# Patient Record
Sex: Female | Born: 2000 | Race: White | Hispanic: No | Marital: Single | Smoking: Never smoker
Health system: Southern US, Community
[De-identification: ages and names within clinical notes are randomized; demographics above are authoritative.]

## PROBLEM LIST (undated history)

## (undated) DIAGNOSIS — J45909 Unspecified asthma, uncomplicated: Secondary | ICD-10-CM

## (undated) DIAGNOSIS — F419 Anxiety disorder, unspecified: Secondary | ICD-10-CM

## (undated) DIAGNOSIS — R519 Headache, unspecified: Secondary | ICD-10-CM

## (undated) DIAGNOSIS — R51 Headache: Secondary | ICD-10-CM

## (undated) DIAGNOSIS — Z9889 Other specified postprocedural states: Secondary | ICD-10-CM

## (undated) DIAGNOSIS — R112 Nausea with vomiting, unspecified: Secondary | ICD-10-CM

## (undated) HISTORY — PX: NO PAST SURGERIES: SHX2092

## (undated) HISTORY — PX: MULTIPLE TOOTH EXTRACTIONS: SHX2053

## (undated) HISTORY — DX: Headache, unspecified: R51.9

## (undated) HISTORY — DX: Headache: R51

---
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
## 2022-05-09 ENCOUNTER — Ambulatory Visit (INDEPENDENT_AMBULATORY_CARE_PROVIDER_SITE_OTHER): Payer: BC Managed Care – PPO | Admitting: Neurology

## 2022-05-09 ENCOUNTER — Encounter (INDEPENDENT_AMBULATORY_CARE_PROVIDER_SITE_OTHER): Payer: Self-pay | Admitting: Neurology

## 2022-05-09 VITALS — BP 116/74 | HR 88 | Wt 170.4 lb

## 2022-05-09 DIAGNOSIS — R42 Dizziness and giddiness: Secondary | ICD-10-CM | POA: Diagnosis not present

## 2022-05-09 DIAGNOSIS — R519 Headache, unspecified: Secondary | ICD-10-CM

## 2022-05-09 DIAGNOSIS — G43109 Migraine with aura, not intractable, without status migrainosus: Secondary | ICD-10-CM

## 2022-05-09 MED ORDER — TOPIRAMATE 25 MG PO TABS: 25.0000 mg | ORAL_TABLET | Freq: Two times a day (BID) | ORAL | 2 refills | Status: DC

## 2022-05-09 NOTE — Patient Instructions (Signed)
We will increase the dose of Topamax to 25 mg twice daily ?Continue with more hydration ?Continue taking dietary supplements ?Make a headache diary ?See an ophthalmologist for eye exam ?Return in 3 months for follow-up visit ?

## 2022-05-09 NOTE — Progress Notes (Signed)
Patient: Leah Stark MRN: [id number] ?Sex: female DOB: 2001-02-04 ? ?Provider: Teressa Lower, MD ?Location of Care: [facility] ? ?Note type: Routine return visit ? ?Referral Source: Dion Body, MD ?History from: patient and [facility] chart ?Chief Complaint: basilar migraine ? ?History of Present Illness: ?Zakeria Kartchner is a 21 y.o. female is here for follow-up management of headache and dizziness. 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  She has been on Topamax since 2018 with fairly low-dose with some help with the headaches. ?She was last seen in July 2022 and over the past year she has been having occasional headaches probably 1 to 3 days a month needed OTC medications but she has been having more days of dizzy spells and lightheadedness off and on and occasionally will be severe but usually she would not have any passing out spells.  Most of the headaches were with mild to moderate intensity but she never had any vomiting with the headaches. ?She has been taking low-dose Topamax at 25 mg every night regularly without any missing doses.  She is also taking dietary supplements particularly riboflavin and magnesium. ?She has not had any eye exam recently.  She usually sleeps well without any difficulty and with no awakening headaches. ? ?Review of Systems: ?Review of system as per HPI, otherwise negative. ? ?Past Medical History:  ?Diagnosis Date  ? Headache   ? ?Hospitalizations: No., Head Injury: No., Nervous System Infections: No., Immunizations up to date: Yes.   ? ? ?Surgical History ?Past Surgical History:  ?Procedure Laterality Date  ? NO PAST SURGERIES    ? ? ?Family History ?family history includes Heart Problems in her paternal grandmother; Heart attack in her maternal grandfather. ? ? ?Social History ?Social History  ? ?Socioeconomic History  ? Marital  status: Single  ?  Spouse name: Not on file  ? Number of children: Not on file  ? Years of education: Not on file  ? Highest education level: Not on file  ?Occupational History  ? Not on file  ?Tobacco Use  ? Smoking status: Never  ? Smokeless tobacco: Never  ?Substance and Sexual Activity  ? Alcohol use: No  ? Drug use: No  ? Sexual activity: Never  ?Other Topics Concern  ? Not on file  ?Social History Narrative  ? Jachelle is going to 12th grade at [employer] . She does well in school.  ? Lives with parents and siblings. She enjoys rowing, arts/crafts, and being on her phone.  ? ?Social Determinants of Health  ? ?Financial Resource Strain: Not on file  ?Food Insecurity: Not on file  ?Transportation Needs: Not on file  ?Physical Activity: Not on file  ?Stress: Not on file  ?Social Connections: Not on file  ? ? ? ?Allergies  ?Allergen Reactions  ? Cephalexin   ?  other  ? ? ?Physical Exam ?BP 116/74   Pulse 88   Wt 170 lb 6.7 oz (77.3 kg)  ?Gen: Awake, alert, not in distress ?Skin: No rash, No neurocutaneous stigmata. ?HEENT: Normocephalic, no dysmorphic features, no conjunctival injection, nares patent, mucous membranes moist, oropharynx clear. ?Neck: Supple, no meningismus. No focal tenderness. ?Resp: Clear to auscultation bilaterally ?CV: Regular rate, normal S1/S2, no murmurs, no rubs ?Abd: BS present, abdomen soft, non-tender, non-distended. No hepatosplenomegaly or mass ?Ext: Warm and well-perfused. No deformities, no muscle wasting, ROM full. ? ?  Neurological Examination: ?MS: Awake, alert, interactive. Normal eye contact, answered the questions appropriately, speech was fluent,  Normal comprehension.  Attention and concentration were normal. ?Cranial Nerves: Pupils were equal and reactive to light ( 5-89mm);  normal fundoscopic exam with sharp discs, visual field full with confrontation test; EOM normal, no nystagmus; no ptsosis, no double vision, intact facial sensation, face symmetric with full strength  of facial muscles, hearing intact to finger rub bilaterally, palate elevation is symmetric, tongue protrusion is symmetric with full movement to both sides.  Sternocleidomastoid and trapezius are with normal strength. ?Tone-Normal ?Strength-Normal strength in all muscle groups ?DTRs- ? Biceps Triceps Brachioradialis Patellar Ankle  ?R 2+ 2+ 2+ 2+ 2+  ?L 2+ 2+ 2+ 2+ 2+  ? ?Plantar responses flexor bilaterally, no clonus noted ?Sensation: Intact to light touch, temperature, vibration, Romberg negative. ?Coordination: No dysmetria on FTN test. No difficulty with balance. ?Gait: Normal walk and run. Tandem gait was normal. Was able to perform toe walking and heel walking without difficulty. ? ? ?Assessment and Plan ?1. Basilar migraine   ?2. Dizziness   ?3. Mild headache   ? ?This is an 21 year old female with episodes of headache and dizziness and lightheadedness with possibility of tension type headache and migraine with slightly more frequent symptoms over the past couple of weeks which was around the time of her end of semester exams. ?I would recommend to increase hydration and drink more water throughout the day particularly in the morning. ?Since she is on very low-dose of Topamax, will increase the dose of Topamax to 25 mg twice daily to see if there would be any help with her symptoms ?She needs to have adequate sleep and limited screen time. ?If she continues with more symptoms, she might need to have further work-up with ophthalmology exam and she may need to be seen by ENT service. ?Again if she continues with more symptoms then we may consider further testing such as brain MRI ?I would like to see her in 3 months for follow-up visit to see how she does and then decide regarding further testing or treatment.  She understood and agreed with the plan. ? ?Meds ordered this encounter  ?Medications  ? topiramate (TOPAMAX) 25 MG tablet  ?  Sig: Take 1 tablet (25 mg total) by mouth 2 (two) times daily.  ?   Dispense:  180 tablet  ?  Refill:  2  ? ?No orders of the defined types were placed in this encounter. ?  ?

## 2022-08-01 ENCOUNTER — Ambulatory Visit (INDEPENDENT_AMBULATORY_CARE_PROVIDER_SITE_OTHER): Payer: BC Managed Care – PPO | Admitting: Neurology

## 2022-08-01 ENCOUNTER — Encounter (INDEPENDENT_AMBULATORY_CARE_PROVIDER_SITE_OTHER): Payer: Self-pay | Admitting: Neurology

## 2022-08-01 VITALS — BP 102/60 | HR 93 | Wt 169.1 lb

## 2022-08-01 DIAGNOSIS — G43109 Migraine with aura, not intractable, without status migrainosus: Secondary | ICD-10-CM

## 2022-08-01 DIAGNOSIS — R42 Dizziness and giddiness: Secondary | ICD-10-CM | POA: Diagnosis not present

## 2022-08-01 DIAGNOSIS — R519 Headache, unspecified: Secondary | ICD-10-CM

## 2022-08-01 MED ORDER — TOPIRAMATE 25 MG PO TABS: 25.0000 mg | ORAL_TABLET | Freq: Two times a day (BID) | ORAL | 2 refills | Status: DC

## 2022-08-01 NOTE — Progress Notes (Signed)
Patient: Leah Stark MRN: [id number] Sex: female DOB: Jan 09, 2001  Provider: Keturah Shavers, MD Location of Care: [facility]  Note type: Routine return visit  Referral Source: Diamantina Monks, MD History from: patient and [facility] chart Chief Complaint: 2 headaches int he last 14 days  History of Present Illness: Leah Stark is a 21 y.o. female is here for follow-up management of headaches and dizziness Improvement. Patient has been seen with episodes of migraine and tension type headaches and occasional basilar migraine for which she has been on Topamax with low-dose with fairly good headache control although she has been having episodes of dizziness off and on with some orthostatic changes and positional changes over the past couple of years without any improvement.  On her last visit in May she was recommended to increase the dose of Topamax to 25 mg twice daily to see if the episodes of dizziness could be part of migraine and would get better.  She is also taking dietary supplements. Since her last visit and over the past 3 months she has not had any frequent headaches but she is still having dizziness and vasovagal events with some positional changes and orthostatic dizziness and occasional balance issues during walking. She denies having any vomiting or any visual changes such as double vision or blurry vision but she thinks that the episodes of dizziness and balance issues are not getting better and probably slightly getting worse. She does have some anxiety issues for which she has been seen and followed by counselor and psychologist but she is not sure if this explains would be related to anxiety or something else.  She has been drinking a lot of water on a daily basis.   Review of Systems: Review of system as per HPI, otherwise negative.  Past Medical History:  Diagnosis Date   Headache    Hospitalizations: No., Head Injury: No., Nervous System Infections: No.,  Immunizations up to date: Yes.     Surgical History Past Surgical History:  Procedure Laterality Date   NO PAST SURGERIES      Family History family history includes Heart Problems in her paternal grandmother; Heart attack in her maternal grandfather.   Social History Social History   Socioeconomic History   Marital status: Single    Spouse name: Not on file   Number of children: Not on file   Years of education: Not on file   Highest education level: Not on file  Occupational History   Not on file  Tobacco Use   Smoking status: Never   Smokeless tobacco: Never  Substance and Sexual Activity   Alcohol use: No   Drug use: No   Sexual activity: Never  Other Topics Concern   Not on file  Social History Narrative   [name] is going to 12th grade at [employer] . She does well in school.   Lives with parents and siblings. She enjoys rowing, arts/crafts, and being on her phone.   Social Determinants of Health   Financial Resource Strain: Not on file  Food Insecurity: Not on file  Transportation Needs: Not on file  Physical Activity: Not on file  Stress: Not on file  Social Connections: Not on file     Allergies  Allergen Reactions   Cephalexin     other    Physical Exam BP 102/60   Pulse 93   Wt 169 lb 1.5 oz (76.7 kg)  Gen: Awake, alert, not in distress Skin: No rash, No neurocutaneous  stigmata. HEENT: Normocephalic, no dysmorphic features, no conjunctival injection, nares patent, mucous membranes moist, oropharynx clear. Neck: Supple, no meningismus. No focal tenderness. Resp: Clear to auscultation bilaterally CV: Regular rate, normal S1/S2, no murmurs, no rubs Abd: BS present, abdomen soft, non-tender, non-distended. No hepatosplenomegaly or mass Ext: Warm and well-perfused. No deformities, no muscle wasting, ROM full.  Neurological Examination: MS: Awake, alert, interactive. Normal eye contact, answered the questions appropriately, speech was  fluent,  Normal comprehension.  Attention and concentration were normal. Cranial Nerves: Pupils were equal and reactive to light ( 5-36mm);  normal fundoscopic exam with sharp discs, visual field full with confrontation test; EOM normal, no nystagmus; no ptsosis, no double vision, intact facial sensation, face symmetric with full strength of facial muscles, hearing intact to finger rub bilaterally, palate elevation is symmetric, tongue protrusion is symmetric with full movement to both sides.  Sternocleidomastoid and trapezius are with normal strength. Tone-Normal Strength-Normal strength in all muscle groups DTRs-  Biceps Triceps Brachioradialis Patellar Ankle  R 2+ 2+ 2+ 2+ 2+  L 2+ 2+ 2+ 2+ 2+   Plantar responses flexor bilaterally, no clonus noted Sensation: Intact to light touch, temperature, vibration, Romberg negative. Coordination: No dysmetria on FTN test. No difficulty with balance. Gait: Normal walk and run. Tandem gait was normal. Was able to perform toe walking and heel walking without difficulty.   Assessment and Plan 1. Basilar migraine   2. Dizziness   3. Mild headache    This is an 21 year old female with history of migraine and tension type headaches with chronic dizzy spells which has been getting worse with some balance issues.  She has no focal findings on her neurological examination but but since she has been having progressively worsening of the symptoms with dizziness and balance issues, I would like to schedule her for a brain MRI for further evaluation and rule out structural abnormality and possibility of white matter disease or inner ear issues. I would recommend to continue the same dose of Topamax which is low-dose of 25 mg twice daily She will continue taking dietary supplements She will continue with more hydration and slight increase salt intake She may take occasional Tylenol or ibuprofen for moderate to severe headache I will ca patient with results of  brain MRI and if there is any further testing or treatment needed. I would like to see her in 5 months for follow-up visit.  She understood and agreed with the plan .    Meds ordered this encounter  Medications   topiramate (TOPAMAX) 25 MG tablet    Sig: Take 1 tablet (25 mg total) by mouth 2 (two) times daily.    Dispense:  180 tablet    Refill:  2   Orders Placed This Encounter  Procedures   MR BRAIN WO CONTRAST    Standing Status:   Future    Standing Expiration Date:   08/02/2023    Order Specific Question:   What is the patient's sedation requirement?    Answer:   No Sedation    Order Specific Question:   Does the patient have a pacemaker or implanted devices?    Answer:   No    Order Specific Question:   Preferred imaging location?    Answer:   [facility] (table limit - 500 lbs)

## 2022-08-01 NOTE — Patient Instructions (Addendum)
Since the headaches and particularly dizziness are getting worse with some balance issues, we will schedule for a brain MRI to rule out any structural abnormality Continue the same dose of Topamax at 25 mg twice daily Continue taking dietary supplements Continue with more hydration and slight increase salt intake Have some pauses between position change like from lying to sitting and then standing Return in 5 months for follow-up visit

## 2022-08-05 ENCOUNTER — Telehealth (INDEPENDENT_AMBULATORY_CARE_PROVIDER_SITE_OTHER): Payer: Self-pay

## 2022-08-05 NOTE — Telephone Encounter (Signed)
PA referral have been filled out, signed, and sent to [employer] for a new PA request. 08/05/2022

## 2022-10-01 ENCOUNTER — Ambulatory Visit (HOSPITAL_COMMUNITY)
Admission: RE | Admit: 2022-10-01 | Discharge: 2022-10-01 | Disposition: A | Discharge status: Home / Self Care | Payer: BC Managed Care – PPO | Source: Ambulatory Visit | Attending: Neurology | Admitting: Neurology

## 2022-10-01 DIAGNOSIS — R42 Dizziness and giddiness: Secondary | ICD-10-CM | POA: Insufficient documentation

## 2022-10-01 DIAGNOSIS — G43109 Migraine with aura, not intractable, without status migrainosus: Secondary | ICD-10-CM | POA: Insufficient documentation

## 2022-10-03 ENCOUNTER — Telehealth (INDEPENDENT_AMBULATORY_CARE_PROVIDER_SITE_OTHER): Payer: Self-pay | Admitting: Neurology

## 2022-10-03 NOTE — Telephone Encounter (Signed)
  Name of who is calling: Ray  Caller's Relationship to Patient: Annye Rusk contact number: [phone]  Provider they see: Dr.Nab  Reason for call: Calling to get results for MRI. Requesting a callback.      PRESCRIPTION REFILL ONLY  Name of prescription:  Pharmacy:

## 2022-10-04 NOTE — Telephone Encounter (Signed)
  Name of who is calling: Diane  Caller's Relationship to Patient:  Best contact number: [phone]  Provider they see: Dr. Secundino Ginger  Reason for call: Levander Campion with  radiology is calling wanting someone to read the results. And or she wants to confirm that we have the results in our chart since it was a big find.

## 2022-10-04 NOTE — Telephone Encounter (Signed)
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  No syrinx. Since she is above 79, I recommend mother to make an appointment with adult neurosurgery over the next few days or couple of weeks and then if there is any referral needed, we can send a referral.  Mother understood and agreed.

## 2022-12-13 ENCOUNTER — Other Ambulatory Visit (HOSPITAL_COMMUNITY): Payer: Self-pay | Admitting: Neurosurgery

## 2022-12-13 DIAGNOSIS — G935 Compression of brain: Secondary | ICD-10-CM

## 2022-12-28 ENCOUNTER — Ambulatory Visit (HOSPITAL_COMMUNITY): Payer: BC Managed Care – PPO

## 2022-12-28 ENCOUNTER — Ambulatory Visit (INDEPENDENT_AMBULATORY_CARE_PROVIDER_SITE_OTHER): Payer: BC Managed Care – PPO | Admitting: Neurology

## 2023-02-02 ENCOUNTER — Other Ambulatory Visit: Payer: Self-pay

## 2023-02-02 ENCOUNTER — Encounter (HOSPITAL_COMMUNITY): Payer: Self-pay | Admitting: *Deleted

## 2023-02-02 NOTE — Progress Notes (Addendum)
I did not find a H/P that was done within the last 30 days.  I called Dr. Windy Carina office, I spoke with Lorriane Shire.  Lorriane Shire said she will ask Dr. Saintclair Halsted to put the H/P in Epic.  Shaquita Bethards denies chest pain or shortness of breath. Patient denies having any s/s of Covid in her household, also denies any known exposure to Covid. Leah Stark has a new PCP, she has not seen yet, it is Dr. Rochele Raring, patient will see the Dr in the spring. Karlene Einstein has been seeing a peds Neurologist, Dr. Vaughan Sine following patient's Chiari malformation type 1, this has been stable, patient takes Topamax 2 times a day t decrease headaches.

## 2023-02-07 ENCOUNTER — Ambulatory Visit (HOSPITAL_COMMUNITY): Payer: BC Managed Care – PPO | Admitting: Anesthesiology

## 2023-02-07 ENCOUNTER — Other Ambulatory Visit: Payer: Self-pay

## 2023-02-07 ENCOUNTER — Ambulatory Visit (HOSPITAL_COMMUNITY)
Admission: RE | Admit: 2023-02-07 | Discharge: 2023-02-07 | Disposition: A | Discharge status: Home / Self Care | Payer: BC Managed Care – PPO | Source: Ambulatory Visit | Attending: Neurosurgery | Admitting: Neurosurgery

## 2023-02-07 ENCOUNTER — Encounter (HOSPITAL_COMMUNITY): Admission: RE | Disposition: A | Discharge status: Home / Self Care | Payer: Self-pay | Source: Home / Self Care

## 2023-02-07 ENCOUNTER — Ambulatory Visit (HOSPITAL_COMMUNITY)
Admission: RE | Admit: 2023-02-07 | Discharge: 2023-02-07 | Disposition: A | Discharge status: Home / Self Care | Payer: BC Managed Care – PPO | Attending: Neurosurgery | Admitting: Neurosurgery

## 2023-02-07 ENCOUNTER — Encounter (HOSPITAL_COMMUNITY): Payer: Self-pay

## 2023-02-07 DIAGNOSIS — H5500 Unspecified nystagmus: Secondary | ICD-10-CM | POA: Diagnosis not present

## 2023-02-07 DIAGNOSIS — G935 Compression of brain: Secondary | ICD-10-CM

## 2023-02-07 DIAGNOSIS — M4804 Spinal stenosis, thoracic region: Secondary | ICD-10-CM | POA: Diagnosis not present

## 2023-02-07 DIAGNOSIS — R42 Dizziness and giddiness: Secondary | ICD-10-CM | POA: Diagnosis present

## 2023-02-07 HISTORY — DX: Unspecified asthma, uncomplicated: J45.909

## 2023-02-07 HISTORY — PX: RADIOLOGY WITH ANESTHESIA: SHX6223

## 2023-02-07 HISTORY — DX: Anxiety disorder, unspecified: F41.9

## 2023-02-07 LAB — BASIC METABOLIC PANEL
Anion gap: 7 (ref 5–15)
BUN: 8 mg/dL (ref 6–20)
CO2: 23 mmol/L (ref 22–32)
Calcium: 9.8 mg/dL (ref 8.9–10.3)
Chloride: 108 mmol/L (ref 98–111)
Creatinine, Ser: 1.02 mg/dL — ABNORMAL HIGH (ref 0.44–1.00)
GFR, Estimated: >60 mL/min (ref >60)
Glucose, Bld: 100 mg/dL — ABNORMAL HIGH (ref 70–99)
Potassium: 3.8 mmol/L (ref 3.5–5.1)
Sodium: 138 mmol/L (ref 135–145)

## 2023-02-07 LAB — POCT PREGNANCY, URINE: Preg Test, Ur: NEGATIVE

## 2023-02-07 SURGERY — MRI WITH ANESTHESIA
Anesthesia: General

## 2023-02-07 MED ORDER — FENTANYL CITRATE (PF) 100 MCG/2ML IJ SOLN: 25.0000 ug | INTRAMUSCULAR | Status: DC | PRN

## 2023-02-07 MED ORDER — ONDANSETRON HCL 4 MG/2ML IJ SOLN
INTRAMUSCULAR | Status: DC | PRN
  Administered 2023-02-07: 4 mg via INTRAVENOUS

## 2023-02-07 MED ORDER — CHLORHEXIDINE GLUCONATE 0.12 % MT SOLN
15.0000 mL | Freq: Once | OROMUCOSAL | Status: AC
  Administered 2023-02-07: 15 mL via OROMUCOSAL
  Filled 2023-02-07: qty 15

## 2023-02-07 MED ORDER — ORAL CARE MOUTH RINSE: 15.0000 mL | Freq: Once | OROMUCOSAL | Status: AC

## 2023-02-07 MED ORDER — SUGAMMADEX SODIUM 200 MG/2ML IV SOLN
INTRAVENOUS | Status: DC | PRN
  Administered 2023-02-07: 200 mg via INTRAVENOUS

## 2023-02-07 MED ORDER — PROPOFOL 10 MG/ML IV BOLUS
INTRAVENOUS | Status: DC | PRN
  Administered 2023-02-07: 150 mg via INTRAVENOUS

## 2023-02-07 MED ORDER — LIDOCAINE 2% (20 MG/ML) 5 ML SYRINGE
INTRAMUSCULAR | Status: DC | PRN
  Administered 2023-02-07: 60 mg via INTRAVENOUS

## 2023-02-07 MED ORDER — OXYCODONE HCL 5 MG PO TABS: 5.0000 mg | ORAL_TABLET | Freq: Once | ORAL | Status: DC | PRN

## 2023-02-07 MED ORDER — MIDAZOLAM HCL 5 MG/5ML IJ SOLN
INTRAMUSCULAR | Status: DC | PRN
  Administered 2023-02-07: 2 mg via INTRAVENOUS

## 2023-02-07 MED ORDER — FENTANYL CITRATE (PF) 100 MCG/2ML IJ SOLN
INTRAMUSCULAR | Status: DC | PRN
  Administered 2023-02-07 (×2): 50 ug via INTRAVENOUS

## 2023-02-07 MED ORDER — OXYCODONE HCL 5 MG/5ML PO SOLN: 5.0000 mg | Freq: Once | ORAL | Status: DC | PRN

## 2023-02-07 MED ORDER — ONDANSETRON HCL 4 MG/2ML IJ SOLN: 4.0000 mg | Freq: Four times a day (QID) | INTRAMUSCULAR | Status: DC | PRN

## 2023-02-07 MED ORDER — DEXAMETHASONE SODIUM PHOSPHATE 4 MG/ML IJ SOLN
INTRAMUSCULAR | Status: DC | PRN
  Administered 2023-02-07: 10 mg via INTRAVENOUS

## 2023-02-07 MED ORDER — LACTATED RINGERS IV SOLN: INTRAVENOUS | Status: DC

## 2023-02-07 MED ORDER — ROCURONIUM BROMIDE 10 MG/ML (PF) SYRINGE
PREFILLED_SYRINGE | INTRAVENOUS | Status: DC | PRN
  Administered 2023-02-07: 70 mg via INTRAVENOUS

## 2023-02-07 NOTE — Transfer of Care (Signed)
Immediate Anesthesia Transfer of Care Note  Patient: Leah Stark  Procedure(s) Performed: MRI CERVICAL AND MRI THORASIC WITHOUT CONTRAST WITH ANESTHESIA  Patient Location: PACU  Anesthesia Type:General  Level of Consciousness: awake, alert , and oriented  Airway & Oxygen Therapy: Patient Spontanous Breathing and Patient connected to nasal cannula oxygen  Post-op Assessment: Report given to RN and Post -op Vital signs reviewed and stable  Post vital signs: Reviewed and stable  Last Vitals:  Vitals Value Taken Time  BP 103/54 02/07/23 0925  Temp 36.5 C 02/07/23 0925  Pulse 68 02/07/23 0927  Resp 14 02/07/23 0927  SpO2 100 % 02/07/23 0927  Vitals shown include unvalidated device data.  Last Pain:  Vitals:   02/07/23 0635  TempSrc:   PainSc: 0-No pain         Complications: No notable events documented.

## 2023-02-07 NOTE — Anesthesia Preprocedure Evaluation (Addendum)
Anesthesia Evaluation  Patient identified by MRN, date of birth, ID band Patient awake    Reviewed: Allergy & Precautions, H&P , NPO status , Patient's Chart, lab work & pertinent test results  Airway Mallampati: II   Neck ROM: full    Dental   Pulmonary asthma    breath sounds clear to auscultation       Cardiovascular negative cardio ROS  Rhythm:regular Rate:Normal     Neuro/Psych  Headaches PSYCHIATRIC DISORDERS Anxiety     Chiari malformation.    GI/Hepatic   Endo/Other    Renal/GU      Musculoskeletal   Abdominal   Peds  Hematology   Anesthesia Other Findings   Reproductive/Obstetrics                             Anesthesia Physical Anesthesia Plan  ASA: 2  Anesthesia Plan: General   Post-op Pain Management:    Induction: Intravenous  PONV Risk Score and Plan: 3 and Ondansetron, Dexamethasone, Midazolam and Treatment may vary due to age or medical condition  Airway Management Planned: Oral ETT  Additional Equipment:   Intra-op Plan:   Post-operative Plan: Extubation in OR  Informed Consent: I have reviewed the patients History and Physical, chart, labs and discussed the procedure including the risks, benefits and alternatives for the proposed anesthesia with the patient or authorized representative who has indicated his/her understanding and acceptance.     Dental advisory given  Plan Discussed with: CRNA, Anesthesiologist and Surgeon  Anesthesia Plan Comments:        Anesthesia Quick Evaluation

## 2023-02-07 NOTE — Progress Notes (Signed)
Urine Pregnancy test negative.

## 2023-02-07 NOTE — Anesthesia Procedure Notes (Signed)
Procedure Name: Intubation Date/Time: 02/07/2023 8:15 AM  Performed by: Lieutenant Diego, CRNAPre-anesthesia Checklist: Patient identified, Emergency Drugs available, Suction available and Patient being monitored Patient Re-evaluated:Patient Re-evaluated prior to induction Oxygen Delivery Method: Circle system utilized Preoxygenation: Pre-oxygenation with 100% oxygen Induction Type: IV induction Ventilation: Mask ventilation without difficulty Laryngoscope Size: Miller and 2 Grade View: Grade I Tube type: Oral Number of attempts: 1 Airway Equipment and Method: Stylet and Oral airway Placement Confirmation: ETT inserted through vocal cords under direct vision, positive ETCO2 and breath sounds checked- equal and bilateral Secured at: 21 cm Tube secured with: Tape Dental Injury: Teeth and Oropharynx as per pre-operative assessment

## 2023-02-07 NOTE — Anesthesia Postprocedure Evaluation (Signed)
Anesthesia Post Note  Patient: Midwife  Procedure(s) Performed: MRI CERVICAL AND MRI THORASIC WITHOUT CONTRAST WITH ANESTHESIA     Patient location during evaluation: PACU Anesthesia Type: General Level of consciousness: awake and alert Pain management: pain level controlled Vital Signs Assessment: post-procedure vital signs reviewed and stable Respiratory status: spontaneous breathing, nonlabored ventilation, respiratory function stable and patient connected to nasal cannula oxygen Cardiovascular status: blood pressure returned to baseline and stable Postop Assessment: no apparent nausea or vomiting Anesthetic complications: no   No notable events documented.  Last Vitals:  Vitals:   02/07/23 0945 02/07/23 0955  BP: 108/67 108/69  Pulse: (!) 58 (!) 59  Resp: 11 12  Temp:  36.5 C  SpO2: 97% 99%    Last Pain:  Vitals:   02/07/23 0955  TempSrc:   PainSc: 0-No pain                 Thorin Starner S

## 2023-02-08 ENCOUNTER — Encounter (HOSPITAL_COMMUNITY): Payer: Self-pay | Admitting: Radiology

## 2023-02-20 ENCOUNTER — Other Ambulatory Visit: Payer: Self-pay | Admitting: Neurosurgery

## 2023-03-03 NOTE — Pre-Procedure Instructions (Signed)
Surgical Instructions    Your procedure is scheduled on March 20, 2023.  Report to [facility] Main Entrance "A" at 5:30 A.M., then check in with the Admitting office.  Call this number if you have problems the morning of surgery:  [phone]  If you have any questions prior to your surgery date call [phone]: Open Monday-Friday 8am-4pm If you experience any cold or flu symptoms such as cough, fever, chills, shortness of breath, etc. between now and your scheduled surgery, please notify us at the above number.     Remember:  Do not eat or drink after midnight the night before your surgery      Take these medicines the morning of surgery with A SIP OF WATER:  topiramate (TOPAMAX)   albuterol (VENTOLIN HFA) inhaler - may take if needed    As of today, STOP taking any Aspirin (unless otherwise instructed by your surgeon) Aleve, Naproxen, Ibuprofen, Motrin, Advil, Goody's, BC's, all herbal medications, fish oil, and all vitamins.                     Do NOT Smoke (Tobacco/Vaping) for 24 hours prior to your procedure.  If you use a CPAP at night, you may bring your mask/headgear for your overnight stay.   Contacts, glasses, piercing's, hearing aid's, dentures or partials may not be worn into surgery, please bring cases for these belongings.    For patients admitted to the hospital, discharge time will be determined by your treatment team.   Patients discharged the day of surgery will not be allowed to drive home, and someone needs to stay with them for 24 hours.  SURGICAL WAITING ROOM VISITATION Patients having surgery or a procedure may have no more than 2 support people in the waiting area - these visitors may rotate.   Children under the age of 34 must have an adult with them who is not the patient. If the patient needs to stay at the hospital during part of their recovery, the visitor guidelines for inpatient rooms apply. Pre-op nurse will coordinate an appropriate time for  1 support person to accompany patient in pre-op.  This support person may not rotate.   Please refer to the [facility] website for the visitor guidelines for Inpatients (after your surgery is over and you are in a regular room).    Special instructions:   [facility]- Preparing For Surgery  Before surgery, you can play an important role. Because skin is not sterile, your skin needs to be as free of germs as possible. You can reduce the number of germs on your skin by washing with CHG (chlorahexidine gluconate) Soap before surgery.  CHG is an antiseptic cleaner which kills germs and bonds with the skin to continue killing germs even after washing.    Oral Hygiene is also important to reduce your risk of infection.  Remember - BRUSH YOUR TEETH THE MORNING OF SURGERY WITH YOUR REGULAR TOOTHPASTE  Please do not use if you have an allergy to CHG or antibacterial soaps. If your skin becomes reddened/irritated stop using the CHG.  Do not shave (including legs and underarms) for at least 48 hours prior to first CHG shower. It is OK to shave your face.  Please follow these instructions carefully.   Shower the NIGHT BEFORE SURGERY and the MORNING OF SURGERY  If you chose to wash your hair, wash your hair first as usual with your normal shampoo.  After you shampoo, rinse your hair and  body thoroughly to remove the shampoo.  Use CHG Soap as you would any other liquid soap. You can apply CHG directly to the skin and wash gently with a scrungie or a clean washcloth.   Apply the CHG Soap to your body ONLY FROM THE NECK DOWN.  Do not use on open wounds or open sores. Avoid contact with your eyes, ears, mouth and genitals (private parts). Wash Face and genitals (private parts)  with your normal soap.   Wash thoroughly, paying special attention to the area where your surgery will be performed.  Thoroughly rinse your body with warm water from the neck down.  DO NOT shower/wash with your normal soap after  using and rinsing off the CHG Soap.  Pat yourself dry with a CLEAN TOWEL.  Wear CLEAN PAJAMAS to bed the night before surgery  Place CLEAN SHEETS on your bed the night before your surgery  DO NOT SLEEP WITH PETS.   Day of Surgery: Take a shower with CHG soap. Do not wear jewelry or makeup Do not wear lotions, powders, perfumes/colognes, or deodorant. Do not shave 48 hours prior to surgery.  Men may shave face and neck. Do not bring valuables to the hospital.  [facility] is not responsible for any belongings or valuables. Do not wear nail polish, gel polish, artificial nails, or any other type of covering on natural nails (fingers and toes) If you have artificial nails or gel coating that need to be removed by a nail salon, please have this removed prior to surgery. Artificial nails or gel coating may interfere with anesthesia's ability to adequately monitor your vital signs. Wear Clean/Comfortable clothing the morning of surgery Remember to brush your teeth WITH YOUR REGULAR TOOTHPASTE.   Please read over the following fact sheets that you were given.    If you received a COVID test during your pre-op visit  it is requested that you wear a mask when out in public, stay away from anyone that may not be feeling well and notify your surgeon if you develop symptoms. If you have been in contact with anyone that has tested positive in the last 10 days please notify you surgeon.

## 2023-03-06 ENCOUNTER — Encounter (HOSPITAL_COMMUNITY)
Admission: RE | Admit: 2023-03-06 | Discharge: 2023-03-06 | Disposition: A | Discharge status: Home / Self Care | Payer: BC Managed Care – PPO | Source: Ambulatory Visit | Attending: Neurosurgery | Admitting: Neurosurgery

## 2023-03-06 ENCOUNTER — Other Ambulatory Visit: Payer: Self-pay

## 2023-03-06 ENCOUNTER — Encounter (HOSPITAL_COMMUNITY): Payer: Self-pay

## 2023-03-06 VITALS — BP 104/72 | HR 91 | Temp 98.3°F | Resp 18 | Ht 66.0 in | Wt 160.0 lb

## 2023-03-06 DIAGNOSIS — Z01818 Encounter for other preprocedural examination: Secondary | ICD-10-CM

## 2023-03-06 DIAGNOSIS — G988 Other disorders of nervous system: Secondary | ICD-10-CM | POA: Diagnosis not present

## 2023-03-06 DIAGNOSIS — Z01812 Encounter for preprocedural laboratory examination: Secondary | ICD-10-CM | POA: Insufficient documentation

## 2023-03-06 LAB — BASIC METABOLIC PANEL
Anion gap: 12 (ref 5–15)
BUN: 10 mg/dL (ref 6–20)
CO2: 22 mmol/L (ref 22–32)
Calcium: 10.3 mg/dL (ref 8.9–10.3)
Chloride: 105 mmol/L (ref 98–111)
Creatinine, Ser: 0.85 mg/dL (ref 0.44–1.00)
GFR, Estimated: >60 mL/min (ref >60)
Glucose, Bld: 104 mg/dL — ABNORMAL HIGH (ref 70–99)
Potassium: 4.3 mmol/L (ref 3.5–5.1)
Sodium: 139 mmol/L (ref 135–145)

## 2023-03-06 LAB — CBC
HCT: 43.1 % (ref 36.0–46.0)
Hemoglobin: 14.5 g/dL (ref 12.0–15.0)
MCH: 29.6 pg (ref 26.0–34.0)
MCHC: 33.6 g/dL (ref 30.0–36.0)
MCV: 88 fL (ref 80.0–100.0)
Platelets: 268 10*3/uL (ref 150–400)
RBC: 4.9 MIL/uL (ref 3.87–5.11)
RDW: 11.7 % (ref 11.5–15.5)
WBC: 5 10*3/uL (ref 4.0–10.5)
nRBC: 0 % (ref 0.0–0.2)

## 2023-03-06 LAB — TYPE AND SCREEN
ABO/RH(D): A POS
Antibody Screen: NEGATIVE

## 2023-03-06 NOTE — Progress Notes (Addendum)
PCP - Dr. Rochele Raring (this is her new PCP now that she has aged out of pediatrics) Cardiologist - Denies Neurologist - Dr. Saintclair Halsted  PPM/ICD - Denies Device Orders - n/a Rep Notified - n/a  Chest x-ray - n/a EKG - Denies Stress Test - Denies ECHO - Denies Cardiac Cath - Denies  Sleep Study - Denies CPAP - n/a  No DM  Last dose of GLP1 agonist- n/a GLP1 instructions: n/a  Blood Thinner Instructions: n/a Aspirin Instructions: n/a  NPO after midnight  COVID TEST- n/a   Anesthesia review: No.  Patient denies shortness of breath, fever, cough and chest pain at PAT appointment. Pt denies any respiratory illness/infection in the last two months.   All instructions explained to the patient, with a verbal understanding of the material. Patient agrees to go over the instructions while at home for a better understanding. Patient also instructed to self quarantine after being tested for COVID-19. The opportunity to ask questions was provided.

## 2023-03-07 LAB — NASOPHARYNGEAL CULTURE: Culture: NORMAL

## 2023-03-20 ENCOUNTER — Other Ambulatory Visit: Payer: Self-pay

## 2023-03-20 ENCOUNTER — Inpatient Hospital Stay (HOSPITAL_COMMUNITY): Payer: BC Managed Care – PPO | Admitting: Anesthesiology

## 2023-03-20 ENCOUNTER — Encounter (HOSPITAL_COMMUNITY): Payer: Self-pay | Admitting: Neurosurgery

## 2023-03-20 ENCOUNTER — Inpatient Hospital Stay (HOSPITAL_COMMUNITY)
Admission: RE | Admit: 2023-03-20 | Discharge: 2023-03-22 | DRG: 027 | Disposition: A | Discharge status: Home / Self Care | Payer: BC Managed Care – PPO | Source: Ambulatory Visit | Attending: Neurosurgery | Admitting: Neurosurgery

## 2023-03-20 ENCOUNTER — Inpatient Hospital Stay (HOSPITAL_COMMUNITY)
Admission: RE | Disposition: A | Discharge status: Home / Self Care | Payer: Self-pay | Source: Ambulatory Visit | Attending: Neurosurgery

## 2023-03-20 DIAGNOSIS — Z9889 Other specified postprocedural states: Principal | ICD-10-CM

## 2023-03-20 DIAGNOSIS — Z79899 Other long term (current) drug therapy: Secondary | ICD-10-CM

## 2023-03-20 DIAGNOSIS — Z8249 Family history of ischemic heart disease and other diseases of the circulatory system: Secondary | ICD-10-CM

## 2023-03-20 DIAGNOSIS — Z881 Allergy status to other antibiotic agents status: Secondary | ICD-10-CM

## 2023-03-20 DIAGNOSIS — G935 Compression of brain: Secondary | ICD-10-CM | POA: Diagnosis present

## 2023-03-20 DIAGNOSIS — Z01818 Encounter for other preprocedural examination: Secondary | ICD-10-CM

## 2023-03-20 DIAGNOSIS — J45909 Unspecified asthma, uncomplicated: Secondary | ICD-10-CM | POA: Diagnosis present

## 2023-03-20 HISTORY — PX: SUBOCCIPITAL CRANIECTOMY CERVICAL LAMINECTOMY: SHX5404

## 2023-03-20 LAB — CBC
HCT: 37.6 % (ref 36.0–46.0)
Hemoglobin: 13.2 g/dL (ref 12.0–15.0)
MCH: 30 pg (ref 26.0–34.0)
MCHC: 35.1 g/dL (ref 30.0–36.0)
MCV: 85.5 fL (ref 80.0–100.0)
Platelets: 213 10*3/uL (ref 150–400)
RBC: 4.4 MIL/uL (ref 3.87–5.11)
RDW: 11.7 % (ref 11.5–15.5)
WBC: 11 10*3/uL — ABNORMAL HIGH (ref 4.0–10.5)
nRBC: 0 % (ref 0.0–0.2)

## 2023-03-20 LAB — BASIC METABOLIC PANEL
Anion gap: 9 (ref 5–15)
BUN: <5 mg/dL — ABNORMAL LOW (ref 6–20)
CO2: 20 mmol/L — ABNORMAL LOW (ref 22–32)
Calcium: 9.2 mg/dL (ref 8.9–10.3)
Chloride: 111 mmol/L (ref 98–111)
Creatinine, Ser: 0.82 mg/dL (ref 0.44–1.00)
GFR, Estimated: >60 mL/min (ref >60)
Glucose, Bld: 151 mg/dL — ABNORMAL HIGH (ref 70–99)
Potassium: 3.8 mmol/L (ref 3.5–5.1)
Sodium: 140 mmol/L (ref 135–145)

## 2023-03-20 LAB — ABO/RH: ABO/RH(D): A POS

## 2023-03-20 LAB — POCT PREGNANCY, URINE: Preg Test, Ur: NEGATIVE

## 2023-03-20 LAB — MRSA NEXT GEN BY PCR, NASAL: MRSA by PCR Next Gen: NOT DETECTED

## 2023-03-20 SURGERY — SUBOCCIPITAL CRANIECTOMY CERVICAL LAMINECTOMY/DURAPLASTY
Anesthesia: General | Site: Neck

## 2023-03-20 MED ORDER — PHENYLEPHRINE HCL-NACL 20-0.9 MG/250ML-% IV SOLN
INTRAVENOUS | Status: AC
  Filled 2023-03-20: qty 250

## 2023-03-20 MED ORDER — ALBUTEROL SULFATE (2.5 MG/3ML) 0.083% IN NEBU: 2.5000 mg | INHALATION_SOLUTION | Freq: Four times a day (QID) | RESPIRATORY_TRACT | Status: DC | PRN

## 2023-03-20 MED ORDER — FENTANYL CITRATE (PF) 100 MCG/2ML IJ SOLN
25.0000 ug | INTRAMUSCULAR | Status: DC | PRN
  Administered 2023-03-20 (×2): 50 ug via INTRAVENOUS

## 2023-03-20 MED ORDER — SODIUM CHLORIDE 0.9 % IV SOLN: INTRAVENOUS | Status: DC | PRN

## 2023-03-20 MED ORDER — LIDOCAINE 2% (20 MG/ML) 5 ML SYRINGE
INTRAMUSCULAR | Status: DC | PRN
  Administered 2023-03-20: 60 mg via INTRAVENOUS

## 2023-03-20 MED ORDER — PROPOFOL 10 MG/ML IV BOLUS
INTRAVENOUS | Status: DC | PRN
  Administered 2023-03-20: 50 mg via INTRAVENOUS
  Administered 2023-03-20: 150 mg via INTRAVENOUS

## 2023-03-20 MED ORDER — LIDOCAINE-EPINEPHRINE 1 %-1:100000 IJ SOLN
INTRAMUSCULAR | Status: AC
  Filled 2023-03-20: qty 1

## 2023-03-20 MED ORDER — PHENYLEPHRINE 80 MCG/ML (10ML) SYRINGE FOR IV PUSH (FOR BLOOD PRESSURE SUPPORT)
PREFILLED_SYRINGE | INTRAVENOUS | Status: DC | PRN
  Administered 2023-03-20: 160 ug via INTRAVENOUS

## 2023-03-20 MED ORDER — DEXAMETHASONE SODIUM PHOSPHATE 10 MG/ML IJ SOLN
INTRAMUSCULAR | Status: AC
  Filled 2023-03-20: qty 1

## 2023-03-20 MED ORDER — SUGAMMADEX SODIUM 200 MG/2ML IV SOLN
INTRAVENOUS | Status: DC | PRN
  Administered 2023-03-20: 200 mg via INTRAVENOUS

## 2023-03-20 MED ORDER — CELECOXIB 200 MG PO CAPS
200.0000 mg | ORAL_CAPSULE | Freq: Once | ORAL | Status: AC
  Administered 2023-03-20: 200 mg via ORAL
  Filled 2023-03-20: qty 1

## 2023-03-20 MED ORDER — HYDROMORPHONE HCL 1 MG/ML IJ SOLN
0.5000 mg | INTRAMUSCULAR | Status: DC | PRN
  Administered 2023-03-20 – 2023-03-22 (×7): 0.5 mg via INTRAVENOUS
  Filled 2023-03-20 (×2): qty 1
  Filled 2023-03-20 (×3): qty 0.5
  Filled 2023-03-20 (×2): qty 1

## 2023-03-20 MED ORDER — LIDOCAINE 2% (20 MG/ML) 5 ML SYRINGE
INTRAMUSCULAR | Status: AC
  Filled 2023-03-20: qty 5

## 2023-03-20 MED ORDER — MIDAZOLAM HCL 2 MG/2ML IJ SOLN
INTRAMUSCULAR | Status: DC | PRN
  Administered 2023-03-20: 2 mg via INTRAVENOUS

## 2023-03-20 MED ORDER — ONDANSETRON HCL 4 MG/2ML IJ SOLN
INTRAMUSCULAR | Status: DC | PRN
  Administered 2023-03-20: 4 mg via INTRAVENOUS

## 2023-03-20 MED ORDER — BUPIVACAINE HCL (PF) 0.25 % IJ SOLN
INTRAMUSCULAR | Status: AC
  Filled 2023-03-20: qty 30

## 2023-03-20 MED ORDER — CHLORHEXIDINE GLUCONATE CLOTH 2 % EX PADS
6.0000 | MEDICATED_PAD | Freq: Once | CUTANEOUS | Status: DC
  Administered 2023-03-20: 6 via TOPICAL

## 2023-03-20 MED ORDER — ROCURONIUM BROMIDE 10 MG/ML (PF) SYRINGE
PREFILLED_SYRINGE | INTRAVENOUS | Status: DC | PRN
  Administered 2023-03-20: 20 mg via INTRAVENOUS
  Administered 2023-03-20: 50 mg via INTRAVENOUS
  Administered 2023-03-20: 20 mg via INTRAVENOUS
  Administered 2023-03-20: 10 mg via INTRAVENOUS
  Administered 2023-03-20: 30 mg via INTRAVENOUS
  Administered 2023-03-20: 20 mg via INTRAVENOUS

## 2023-03-20 MED ORDER — ORAL CARE MOUTH RINSE: 15.0000 mL | Freq: Once | OROMUCOSAL | Status: AC

## 2023-03-20 MED ORDER — THROMBIN (RECOMBINANT) 5000 UNITS EX SOLR
CUTANEOUS | Status: DC | PRN
  Administered 2023-03-20: 1

## 2023-03-20 MED ORDER — MIDAZOLAM HCL 2 MG/2ML IJ SOLN
INTRAMUSCULAR | Status: AC
  Filled 2023-03-20: qty 2

## 2023-03-20 MED ORDER — MAGNESIUM OXIDE -MG SUPPLEMENT 400 (240 MG) MG PO TABS
400.0000 mg | ORAL_TABLET | Freq: Every day | ORAL | Status: DC
  Administered 2023-03-20 – 2023-03-21 (×2): 400 mg via ORAL
  Filled 2023-03-20 (×2): qty 1

## 2023-03-20 MED ORDER — PROPOFOL 10 MG/ML IV BOLUS
INTRAVENOUS | Status: AC
  Filled 2023-03-20: qty 20

## 2023-03-20 MED ORDER — TOPIRAMATE 25 MG PO TABS
25.0000 mg | ORAL_TABLET | Freq: Two times a day (BID) | ORAL | Status: DC
  Administered 2023-03-20 – 2023-03-22 (×5): 25 mg via ORAL
  Filled 2023-03-20 (×6): qty 1

## 2023-03-20 MED ORDER — ONDANSETRON HCL 4 MG/2ML IJ SOLN
INTRAMUSCULAR | Status: AC
  Filled 2023-03-20: qty 2

## 2023-03-20 MED ORDER — DOCUSATE SODIUM 100 MG PO CAPS
100.0000 mg | ORAL_CAPSULE | Freq: Two times a day (BID) | ORAL | Status: DC
  Administered 2023-03-21 – 2023-03-22 (×3): 100 mg via ORAL
  Filled 2023-03-20 (×3): qty 1

## 2023-03-20 MED ORDER — ACETAMINOPHEN 500 MG PO TABS
1000.0000 mg | ORAL_TABLET | Freq: Once | ORAL | Status: AC
  Administered 2023-03-20: 1000 mg via ORAL
  Filled 2023-03-20: qty 2

## 2023-03-20 MED ORDER — OXYCODONE HCL 5 MG PO TABS: 5.0000 mg | ORAL_TABLET | Freq: Once | ORAL | Status: DC | PRN

## 2023-03-20 MED ORDER — ONDANSETRON HCL 4 MG/2ML IJ SOLN
4.0000 mg | INTRAMUSCULAR | Status: DC | PRN
  Administered 2023-03-20 – 2023-03-21 (×3): 4 mg via INTRAVENOUS
  Filled 2023-03-20 (×3): qty 2

## 2023-03-20 MED ORDER — BACITRACIN ZINC 500 UNIT/GM EX OINT
TOPICAL_OINTMENT | CUTANEOUS | Status: DC | PRN
  Administered 2023-03-20: 1 via TOPICAL

## 2023-03-20 MED ORDER — HYDROMORPHONE HCL 1 MG/ML IJ SOLN
INTRAMUSCULAR | Status: AC
  Filled 2023-03-20: qty 1

## 2023-03-20 MED ORDER — 0.9 % SODIUM CHLORIDE (POUR BTL) OPTIME
TOPICAL | Status: DC | PRN
  Administered 2023-03-20 (×2): 1000 mL

## 2023-03-20 MED ORDER — OXYCODONE HCL 5 MG/5ML PO SOLN: 5.0000 mg | Freq: Once | ORAL | Status: DC | PRN

## 2023-03-20 MED ORDER — DEXAMETHASONE SODIUM PHOSPHATE 4 MG/ML IJ SOLN
4.0000 mg | Freq: Three times a day (TID) | INTRAMUSCULAR | Status: DC
  Administered 2023-03-22: 4 mg via INTRAVENOUS
  Filled 2023-03-20: qty 1

## 2023-03-20 MED ORDER — LACTATED RINGERS IV SOLN: INTRAVENOUS | Status: DC

## 2023-03-20 MED ORDER — CEFAZOLIN SODIUM-DEXTROSE 2-3 GM-%(50ML) IV SOLR
INTRAVENOUS | Status: DC | PRN
  Administered 2023-03-20: 2 g via INTRAVENOUS

## 2023-03-20 MED ORDER — PHENOL 1.4 % MT LIQD
1.0000 | OROMUCOSAL | Status: DC | PRN
  Administered 2023-03-20: 1 via OROMUCOSAL
  Filled 2023-03-20: qty 177

## 2023-03-20 MED ORDER — CHLORHEXIDINE GLUCONATE CLOTH 2 % EX PADS: 6.0000 | MEDICATED_PAD | Freq: Once | CUTANEOUS | Status: DC

## 2023-03-20 MED ORDER — ONDANSETRON HCL 4 MG PO TABS: 4.0000 mg | ORAL_TABLET | ORAL | Status: DC | PRN

## 2023-03-20 MED ORDER — CHLORHEXIDINE GLUCONATE 0.12 % MT SOLN
15.0000 mL | Freq: Once | OROMUCOSAL | Status: AC
  Administered 2023-03-20: 15 mL via OROMUCOSAL
  Filled 2023-03-20: qty 15

## 2023-03-20 MED ORDER — HYDROCODONE-ACETAMINOPHEN 5-325 MG PO TABS
1.0000 | ORAL_TABLET | ORAL | Status: DC | PRN
  Administered 2023-03-20 – 2023-03-22 (×10): 1 via ORAL
  Filled 2023-03-20 (×10): qty 1

## 2023-03-20 MED ORDER — FENTANYL CITRATE (PF) 100 MCG/2ML IJ SOLN
INTRAMUSCULAR | Status: AC
  Filled 2023-03-20: qty 2

## 2023-03-20 MED ORDER — BACITRACIN ZINC 500 UNIT/GM EX OINT
TOPICAL_OINTMENT | CUTANEOUS | Status: AC
  Filled 2023-03-20: qty 28.35

## 2023-03-20 MED ORDER — FENTANYL CITRATE (PF) 250 MCG/5ML IJ SOLN
INTRAMUSCULAR | Status: AC
  Filled 2023-03-20: qty 5

## 2023-03-20 MED ORDER — THROMBIN 5000 UNITS EX SOLR
CUTANEOUS | Status: AC
  Filled 2023-03-20: qty 10000

## 2023-03-20 MED ORDER — VITAMIN B-2 100 MG PO TABS: 100.0000 mg | ORAL_TABLET | Freq: Every day | ORAL | Status: DC

## 2023-03-20 MED ORDER — PHENYLEPHRINE HCL-NACL 20-0.9 MG/250ML-% IV SOLN
INTRAVENOUS | Status: DC | PRN
  Administered 2023-03-20: 30 ug/min via INTRAVENOUS

## 2023-03-20 MED ORDER — LABETALOL HCL 5 MG/ML IV SOLN: 10.0000 mg | INTRAVENOUS | Status: DC | PRN

## 2023-03-20 MED ORDER — FENTANYL CITRATE (PF) 250 MCG/5ML IJ SOLN
INTRAMUSCULAR | Status: DC | PRN
  Administered 2023-03-20: 50 ug via INTRAVENOUS
  Administered 2023-03-20 (×2): 25 ug via INTRAVENOUS
  Administered 2023-03-20 (×3): 50 ug via INTRAVENOUS

## 2023-03-20 MED ORDER — REMIFENTANIL HCL 2 MG IV SOLR
INTRAVENOUS | Status: AC
  Filled 2023-03-20: qty 2000

## 2023-03-20 MED ORDER — POTASSIUM CHLORIDE IN NACL 20-0.9 MEQ/L-% IV SOLN
INTRAVENOUS | Status: DC
  Filled 2023-03-20 (×3): qty 1000

## 2023-03-20 MED ORDER — DEXAMETHASONE SODIUM PHOSPHATE 10 MG/ML IJ SOLN
INTRAMUSCULAR | Status: DC | PRN
  Administered 2023-03-20: 10 mg via INTRAVENOUS

## 2023-03-20 MED ORDER — VANCOMYCIN HCL IN DEXTROSE 1-5 GM/200ML-% IV SOLN
1000.0000 mg | INTRAVENOUS | Status: AC
  Administered 2023-03-20: 1000 mg via INTRAVENOUS
  Filled 2023-03-20: qty 200

## 2023-03-20 MED ORDER — PANTOPRAZOLE SODIUM 40 MG IV SOLR
40.0000 mg | Freq: Every day | INTRAVENOUS | Status: DC
  Administered 2023-03-20: 40 mg via INTRAVENOUS
  Filled 2023-03-20: qty 10

## 2023-03-20 MED ORDER — EPHEDRINE SULFATE-NACL 50-0.9 MG/10ML-% IV SOSY
PREFILLED_SYRINGE | INTRAVENOUS | Status: DC | PRN
  Administered 2023-03-20: 5 mg via INTRAVENOUS
  Administered 2023-03-20: 10 mg via INTRAVENOUS
  Administered 2023-03-20 (×2): 5 mg via INTRAVENOUS

## 2023-03-20 MED ORDER — DEXAMETHASONE SODIUM PHOSPHATE 4 MG/ML IJ SOLN
4.0000 mg | Freq: Four times a day (QID) | INTRAMUSCULAR | Status: AC
  Administered 2023-03-21 – 2023-03-22 (×4): 4 mg via INTRAVENOUS
  Filled 2023-03-20 (×4): qty 1

## 2023-03-20 MED ORDER — REMIFENTANIL HCL 2 MG IV SOLR
INTRAVENOUS | Status: DC | PRN
  Administered 2023-03-20: .05 ug/kg/min via INTRAVENOUS

## 2023-03-20 MED ORDER — PROMETHAZINE HCL 25 MG PO TABS
12.5000 mg | ORAL_TABLET | ORAL | Status: DC | PRN
  Administered 2023-03-20: 25 mg via ORAL
  Filled 2023-03-20: qty 1

## 2023-03-20 MED ORDER — CHLORHEXIDINE GLUCONATE CLOTH 2 % EX PADS
6.0000 | MEDICATED_PAD | Freq: Every day | CUTANEOUS | Status: DC
  Administered 2023-03-20 – 2023-03-21 (×2): 6 via TOPICAL

## 2023-03-20 MED ORDER — HYDROMORPHONE HCL 1 MG/ML IJ SOLN
0.5000 mg | Freq: Once | INTRAMUSCULAR | Status: AC
  Administered 2023-03-20: 0.5 mg via INTRAVENOUS

## 2023-03-20 MED ORDER — BUPIVACAINE HCL (PF) 0.25 % IJ SOLN
INTRAMUSCULAR | Status: DC | PRN
  Administered 2023-03-20: 10 mL

## 2023-03-20 MED ORDER — CEFAZOLIN SODIUM-DEXTROSE 2-4 GM/100ML-% IV SOLN
2.0000 g | Freq: Three times a day (TID) | INTRAVENOUS | Status: AC
  Administered 2023-03-20 (×2): 2 g via INTRAVENOUS
  Filled 2023-03-20 (×2): qty 100

## 2023-03-20 MED ORDER — DEXAMETHASONE SODIUM PHOSPHATE 10 MG/ML IJ SOLN
6.0000 mg | Freq: Four times a day (QID) | INTRAMUSCULAR | Status: AC
  Administered 2023-03-20 – 2023-03-21 (×4): 6 mg via INTRAVENOUS
  Filled 2023-03-20 (×4): qty 1

## 2023-03-20 MED ORDER — ADHERUS DURAL SEALANT
PACK | TOPICAL | Status: DC | PRN
  Administered 2023-03-20: 1 via TOPICAL

## 2023-03-20 MED ORDER — LIDOCAINE-EPINEPHRINE 1 %-1:100000 IJ SOLN
INTRAMUSCULAR | Status: DC | PRN
  Administered 2023-03-20: 10 mL

## 2023-03-20 MED ORDER — B COMPLEX-C PO TABS
1.0000 | ORAL_TABLET | Freq: Every day | ORAL | Status: DC
  Administered 2023-03-20 – 2023-03-22 (×3): 1 via ORAL
  Filled 2023-03-20 (×3): qty 1

## 2023-03-20 MED ORDER — ROCURONIUM BROMIDE 10 MG/ML (PF) SYRINGE
PREFILLED_SYRINGE | INTRAVENOUS | Status: AC
  Filled 2023-03-20: qty 10

## 2023-03-20 SURGICAL SUPPLY — 70 items
ADH SKN CLS APL DERMABOND .7 (GAUZE/BANDAGES/DRESSINGS) ×1
APL SKNCLS STERI-STRIP NONHPOA (GAUZE/BANDAGES/DRESSINGS)
BAG COUNTER SPONGE SURGICOUNT (BAG) ×2 IMPLANT
BAG SPNG CNTER NS LX DISP (BAG) ×1
BAND INSRT 18 STRL LF DISP RB (MISCELLANEOUS)
BAND RUBBER #18 3X1/16 STRL (MISCELLANEOUS) IMPLANT
BENZOIN TINCTURE PRP APPL 2/3 (GAUZE/BANDAGES/DRESSINGS) IMPLANT
BLADE CLIPPER SURG (BLADE) ×4 IMPLANT
BLADE SURG 11 STRL SS (BLADE) ×2 IMPLANT
BLADE ULTRA TIP 2M (BLADE) IMPLANT
BUR ACORN 9.0 PRECISION (BURR) ×2 IMPLANT
CANISTER SUCT 3000ML PPV (MISCELLANEOUS) ×2 IMPLANT
CATH COUDE FOLEY 5CC 12FR (CATHETERS) IMPLANT
CLIP TI MEDIUM 6 (CLIP) IMPLANT
DERMABOND ADVANCED .7 DNX12 (GAUZE/BANDAGES/DRESSINGS) ×2 IMPLANT
DRAPE LAPAROTOMY 100X72 PEDS (DRAPES) ×2 IMPLANT
DRAPE MICROSCOPE SLANT 54X150 (MISCELLANEOUS) IMPLANT
DRAPE SURG 17X23 STRL (DRAPES) ×4 IMPLANT
DRAPE WARM FLUID 44X44 (DRAPES) ×2 IMPLANT
DRSG OPSITE POSTOP 4X8 (GAUZE/BANDAGES/DRESSINGS) IMPLANT
DURAGUARD 06CMX08CM IMPLANT
ELECT REM PT RETURN 9FT ADLT (ELECTROSURGICAL) ×1
ELECTRODE REM PT RTRN 9FT ADLT (ELECTROSURGICAL) ×2 IMPLANT
EVACUATOR 1/8 PVC DRAIN (DRAIN) IMPLANT
EVACUATOR SILICONE 100CC (DRAIN) IMPLANT
FORCEPS BIPOLAR SPETZLER 8 1.0 (NEUROSURGERY SUPPLIES) IMPLANT
GAUZE 4X4 16PLY ~~LOC~~+RFID DBL (SPONGE) IMPLANT
GAUZE SPONGE 4X4 12PLY STRL (GAUZE/BANDAGES/DRESSINGS) IMPLANT
GLOVE BIO SURGEON STRL SZ7 (GLOVE) IMPLANT
GLOVE BIO SURGEON STRL SZ7.5 (GLOVE) IMPLANT
GLOVE BIO SURGEON STRL SZ8 (GLOVE) ×2 IMPLANT
GLOVE BIOGEL PI IND STRL 7.0 (GLOVE) IMPLANT
GLOVE EXAM NITRILE XL STR (GLOVE) IMPLANT
GLOVE INDICATOR 8.5 STRL (GLOVE) ×4 IMPLANT
GOWN STRL REUS W/ TWL LRG LVL3 (GOWN DISPOSABLE) ×2 IMPLANT
GOWN STRL REUS W/ TWL XL LVL3 (GOWN DISPOSABLE) ×2 IMPLANT
GOWN STRL REUS W/TWL 2XL LVL3 (GOWN DISPOSABLE) ×2 IMPLANT
GOWN STRL REUS W/TWL LRG LVL3 (GOWN DISPOSABLE) ×1
GOWN STRL REUS W/TWL XL LVL3 (GOWN DISPOSABLE) ×1
HEMOSTAT SURGICEL 2X14 (HEMOSTASIS) ×2 IMPLANT
KIT BASIN OR (CUSTOM PROCEDURE TRAY) ×2 IMPLANT
KIT TURNOVER KIT B (KITS) ×2 IMPLANT
NEEDLE HYPO 22GX1.5 SAFETY (NEEDLE) ×2 IMPLANT
NS IRRIG 1000ML POUR BTL (IV SOLUTION) ×2 IMPLANT
PACK LAMINECTOMY NEURO (CUSTOM PROCEDURE TRAY) ×2 IMPLANT
PAD ARMBOARD 7.5X6 YLW CONV (MISCELLANEOUS) ×6 IMPLANT
PATTIES SURGICAL .5 X.5 (GAUZE/BANDAGES/DRESSINGS) IMPLANT
PATTIES SURGICAL .5 X3 (DISPOSABLE) IMPLANT
PATTIES SURGICAL 1/4 X 3 (GAUZE/BANDAGES/DRESSINGS) ×2 IMPLANT
SEALANT ADHERUS EXTEND TIP (MISCELLANEOUS) IMPLANT
SOL ELECTROSURG ANTI STICK (MISCELLANEOUS) ×1
SOLUTION ELECTROSURG ANTI STCK (MISCELLANEOUS) ×2 IMPLANT
SPONGE SURGIFOAM ABS GEL SZ50 (HEMOSTASIS) ×2 IMPLANT
SPONGE T-LAP 4X18 ~~LOC~~+RFID (SPONGE) IMPLANT
STAPLER SKIN PROX WIDE 3.9 (STAPLE) IMPLANT
SUT ETHILON 2 0 FS 18 (SUTURE) IMPLANT
SUT ETHILON 3 0 FSL (SUTURE) ×2 IMPLANT
SUT NURALON 4 0 TR CR/8 (SUTURE) ×4 IMPLANT
SUT PROLENE 6 0 BV (SUTURE) IMPLANT
SUT VIC AB 0 CT1 18XCR BRD8 (SUTURE) IMPLANT
SUT VIC AB 0 CT1 8-18 (SUTURE) ×1
SUT VIC AB 1 CT1 18XBRD ANBCTR (SUTURE) ×2 IMPLANT
SUT VIC AB 1 CT1 8-18 (SUTURE) ×1
SUT VIC AB 2-0 CT1 18 (SUTURE) ×2 IMPLANT
SUT VIC AB 3-0 SH 8-18 (SUTURE) IMPLANT
TOWEL GREEN STERILE (TOWEL DISPOSABLE) ×2 IMPLANT
TOWEL GREEN STERILE FF (TOWEL DISPOSABLE) IMPLANT
TRAY FOLEY MTR SLVR 16FR STAT (SET/KITS/TRAYS/PACK) IMPLANT
UNDERPAD 30X36 HEAVY ABSORB (UNDERPADS AND DIAPERS) IMPLANT
WATER STERILE IRR 1000ML POUR (IV SOLUTION) ×2 IMPLANT

## 2023-03-20 NOTE — Transfer of Care (Signed)
Immediate Anesthesia Transfer of Care Note  Patient: Leah Stark  Procedure(s) Performed: Suboccipital Craniectomy and Cervical One Laminectomy Cervical Two Laminectomy for Chiari Decompression (Neck)  Patient Location: PACU  Anesthesia Type:General  Level of Consciousness: awake, drowsy, and patient cooperative  Airway & Oxygen Therapy: Patient Spontanous Breathing  Post-op Assessment: Report given to RN, Post -op Vital signs reviewed and stable, and Patient moving all extremities  Post vital signs: stable  Last Vitals:  Vitals Value Taken Time  BP 110/58 03/20/23 1200  Temp    Pulse 117 03/20/23 1201  Resp 11 03/20/23 1201  SpO2 94 % 03/20/23 1201  Vitals shown include unvalidated device data.  Last Pain:  Vitals:   03/20/23 0604  TempSrc:   PainSc: 0-No pain      Patients Stated Pain Goal: 0 ([id number])  Complications: No notable events documented.

## 2023-03-20 NOTE — Anesthesia Procedure Notes (Signed)
Procedure Name: Intubation Date/Time: 03/20/2023 7:46 AM  Performed by: Leonor Liv, CRNAPre-anesthesia Checklist: Patient identified, Emergency Drugs available, Suction available and Patient being monitored Patient Re-evaluated:Patient Re-evaluated prior to induction Oxygen Delivery Method: Circle System Utilized Preoxygenation: Pre-oxygenation with 100% oxygen Induction Type: IV induction Ventilation: Mask ventilation without difficulty Grade View: Grade I Tube type: Oral Tube size: 7.0 mm Number of attempts: 1 Airway Equipment and Method: Stylet Placement Confirmation: ETT inserted through vocal cords under direct vision, positive ETCO2 and breath sounds checked- equal and bilateral Secured at: 22 cm Tube secured with: Tape Dental Injury: Teeth and Oropharynx as per pre-operative assessment

## 2023-03-20 NOTE — Op Note (Signed)
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  Postoperative diagnosis: Same.  Procedure: Suboccipital craniectomy C1-C2 laminectomy with duraplasty for decompression of Chiari malformation lysing bovine pericardium.    Surgeon: Kary Kos.    Assistant: Kristeen Miss.  Assistant to: Nash Shearer.  Anesthesia: General.  EBL: Minimal.  HPI: 22 year old with intractable headaches and dizziness and workup revealed severe Chiari I malformation with tonsillar distention to C2 and cervical medullary compression and stenosis.  Due to patient's progression of clinical syndrome imaging will and failed conservative treatment I recommended suboccipital craniectomy C1-2 laminectomy for decompression of Chiari.  I extensively reviewed the risks and benefits of the operation with her as well as perioperative course expectations of outcome and alternatives to surgery and she understood and agreed to proceed forward.  Operative procedure: Patient was induced under general anesthesia positioned prone in pins backside of her head neck was prepped and draped in routine sterile fashion.  After infiltration of 10 cc lidocaine with epi midline incision was made and Bovie cautery was used to take down the subcutaneous tissue and subperiosteal dissection was carried out on the lamina of C1-C2-C3 as well as exposed the suboccipital at in the cerebellar hemispheres.  After adequate exposure been achieved using a high-speed drill to drill down the subocciput and opened up the frame magnum drilled down the thinned out the C1-C2 lamina and remove that in piecemeal fashion.  Marching laterally I extended the C1-C2 laminectomy as laterally as I felt we needed to.  Then opened up the dura in a Y-shaped fashion overlying the midline behind C2 initially extended up towards the foramen, and then opened up over the cerebellar hemispheres in a  Y-shaped fashion..  The arachnoid was kept intact during the dural opening then opened up the arachnoid widely visualized the cerebellar tonsils which had descended to the superior aspect of the C2 lamina.  There freed up arachnoid adhesions laterally along the cerebellar tonsils to where they were freely mobile and I was clearly visualizing the cervical medullary junction upper cervical spinal cord and CSF flow easily.  At this point I cut a piece of bovine pericardium and fashioned it and utilizing 4 Nurolon's sewed the patch in a watertight fashion creating a pouch.  Due to Valsalva to ensure no leaks after reinforcing the edges of the duraplasty.  Then when no leak was appreciated on Valsalva I used the green glue along the suture line as well as Gelfoam at this point meticulous hemostasis was maintained the wound was closed in layers with 0 interrupted Vicryl in the fascia 2-0 interrupted Vicryl and the subcutaneous tissue and the skin was closed with a running nylon.  At the end the case all needle count sponge counts were correct and patient was remitted to recovery in stable condition.

## 2023-03-20 NOTE — Anesthesia Preprocedure Evaluation (Addendum)
Anesthesia Evaluation  Patient identified by MRN, date of birth, ID band Patient awake    Reviewed: Allergy & Precautions, NPO status , Patient's Chart, lab work & pertinent test results  Airway Mallampati: II  TM Distance: >3 FB Neck ROM: Full    Dental no notable dental hx.    Pulmonary asthma    Pulmonary exam normal        Cardiovascular negative cardio ROS  Rhythm:Regular Rate:Normal     Neuro/Psych  Headaches  Anxiety     Chiari malformation    GI/Hepatic negative GI ROS, Neg liver ROS,,,  Endo/Other  negative endocrine ROS    Renal/GU negative Renal ROS  negative genitourinary   Musculoskeletal negative musculoskeletal ROS (+)    Abdominal Normal abdominal exam  (+)   Peds  Hematology negative hematology ROS (+) Lab Results      Component                Value               Date                      WBC                      5.0                 03/06/2023                HGB                      14.5                03/06/2023                HCT                      43.1                03/06/2023                MCV                      88.0                03/06/2023                PLT                      268                 03/06/2023             Lab Results      Component                Value               Date                      NA                       139                 03/06/2023                K  4.3                 03/06/2023                CO2                      22                  03/06/2023                GLUCOSE                  104 (H)             03/06/2023                BUN                      10                  03/06/2023                CREATININE               0.85                03/06/2023                CALCIUM                  10.3                03/06/2023                GFRNONAA                 >60                 03/06/2023              Anesthesia Other  Findings   Reproductive/Obstetrics                             Anesthesia Physical Anesthesia Plan  ASA: 3  Anesthesia Plan: General   Post-op Pain Management: Celebrex PO (pre-op)* and Tylenol PO (pre-op)*   Induction: Intravenous  PONV Risk Score and Plan: 3 and Ondansetron, Dexamethasone, Midazolam and Treatment may vary due to age or medical condition  Airway Management Planned: Mask and Oral ETT  Additional Equipment: ClearSight  Intra-op Plan:   Post-operative Plan: Extubation in OR  Informed Consent: I have reviewed the patients History and Physical, chart, labs and discussed the procedure including the risks, benefits and alternatives for the proposed anesthesia with the patient or authorized representative who has indicated his/her understanding and acceptance.     Dental advisory given  Plan Discussed with: CRNA  Anesthesia Plan Comments: (Lab Results      Component                Value               Date                      WBC                      5.0                 03/06/2023  HGB                      14.5                03/06/2023                HCT                      43.1                03/06/2023                MCV                      88.0                03/06/2023                PLT                      268                 03/06/2023             Lab Results      Component                Value               Date                      NA                       139                 03/06/2023                K                        4.3                 03/06/2023                CO2                      22                  03/06/2023                GLUCOSE                  104 (H)             03/06/2023                BUN                      10                  03/06/2023                CREATININE               0.85                03/06/2023                CALCIUM  10.3                03/06/2023                 GFRNONAA                 >60                 03/06/2023           )       Anesthesia Quick Evaluation

## 2023-03-20 NOTE — H&P (Signed)
Leah Stark is an 22 y.o. female.   Chief Complaint: Headaches and dizziness HPI: 22 year old with headaches and dizziness [name] nystagmus workup revealed Chiari I malformation with tonsillar distention down to C2.  Significant crowding at the cervical medullary junction.  Due to her progression of clinical syndrome imaging findings of a conservative treatment I recommended suboccipital craniectomy and C1-C2 laminectomy for decompression of Chiari malformation.  I extensively reviewed the risks and benefits of that procedure with her as well as perioperative course expectations of outcome and alternatives of surgery and she understands and agrees to proceed forward.  Past Medical History:  Diagnosis Date   Anxiety    Asthma    Headache     Past Surgical History:  Procedure Laterality Date   MULTIPLE TOOTH EXTRACTIONS     2 teeth   RADIOLOGY WITH ANESTHESIA N/A 02/07/2023   Procedure: MRI CERVICAL AND MRI THORASIC WITHOUT CONTRAST WITH ANESTHESIA;  Surgeon: Radiologist, Medication, MD;  Location: [facility];  Service: Radiology;  Laterality: N/A;    Family History  Problem Relation Age of Onset   Heart attack Maternal Grandfather    Heart Problems Paternal Grandmother    Social History:  reports that she has never smoked. She has never used smokeless tobacco. She reports that she does not drink alcohol and does not use drugs.  Allergies:  Allergies  Allergen Reactions   Cephalexin Rash    Medications Prior to Admission  Medication Sig Dispense Refill   Magnesium Oxide 500 MG TABS Take 500 mg by mouth at bedtime.     riboflavin (VITAMIN B-2) 100 MG TABS tablet Take 100 mg by mouth at bedtime.     topiramate (TOPAMAX) 25 MG tablet Take 1 tablet (25 mg total) by mouth 2 (two) times daily. 180 tablet 2   albuterol (VENTOLIN HFA) 108 (90 Base) MCG/ACT inhaler Inhale 2 puffs into the lungs every 6 (six) hours as needed for wheezing or shortness of breath.      Results for orders placed or  performed during the hospital encounter of 03/20/23 (from the past 48 hour(s))  Pregnancy, urine POC     Status: None   Collection Time: 03/20/23  5:50 AM  Result Value Ref Range   Preg Test, Ur NEGATIVE NEGATIVE    Comment:        THE SENSITIVITY OF THIS METHODOLOGY IS >24 mIU/mL   ABO/Rh     Status: None (Preliminary result)   Collection Time: 03/20/23  6:40 AM  Result Value Ref Range   ABO/RH(D) PENDING    No results found.  Review of Systems  Musculoskeletal:  Positive for neck pain.  Neurological:  Positive for dizziness and headaches.    Blood pressure 110/76, pulse 71, temperature 97.8 F (36.6 C), temperature source Oral, resp. rate 20, height 5\' 6"  (1.676 m), weight 72.6 kg, last menstrual period 02/28/2023, SpO2 97 %. Physical Exam HENT:     Head: Normocephalic.     Right Ear: Tympanic membrane normal.     Nose: Nose normal.     Mouth/Throat:     Mouth: Mucous membranes are moist.  Eyes:     Pupils: Pupils are equal, round, and reactive to light.  Cardiovascular:     Rate and Rhythm: Normal rate.  Pulmonary:     Effort: Pulmonary effort is normal.  Abdominal:     General: Abdomen is flat.  Musculoskeletal:        General: Normal range of motion.  Skin:  General: Skin is warm.  Neurological:     Mental Status: She is alert.     Comments: Patient awake and alert pupils are equal extract movements are intact strength is 5 out of 5 upper and lower extremities.      Assessment/Plan 22 year old female presents for suboccipital craniectomy C1-2 laminectomy for decompression Chiari malformation  Elaina Hoops, MD 03/20/2023, 7:24 AM

## 2023-03-21 ENCOUNTER — Encounter (HOSPITAL_COMMUNITY): Payer: Self-pay | Admitting: Neurosurgery

## 2023-03-21 LAB — BASIC METABOLIC PANEL
Anion gap: 8 (ref 5–15)
BUN: 6 mg/dL (ref 6–20)
CO2: 20 mmol/L — ABNORMAL LOW (ref 22–32)
Calcium: 9.1 mg/dL (ref 8.9–10.3)
Chloride: 106 mmol/L (ref 98–111)
Creatinine, Ser: 0.71 mg/dL (ref 0.44–1.00)
GFR, Estimated: >60 mL/min (ref >60)
Glucose, Bld: 143 mg/dL — ABNORMAL HIGH (ref 70–99)
Potassium: 3.9 mmol/L (ref 3.5–5.1)
Sodium: 134 mmol/L — ABNORMAL LOW (ref 135–145)

## 2023-03-21 MED ORDER — PANTOPRAZOLE SODIUM 40 MG PO TBEC
40.0000 mg | DELAYED_RELEASE_TABLET | Freq: Every day | ORAL | Status: DC
  Administered 2023-03-21: 40 mg via ORAL
  Filled 2023-03-21: qty 1

## 2023-03-21 MED FILL — Thrombin For Soln 5000 Unit: CUTANEOUS | Qty: 2 | Status: AC

## 2023-03-21 NOTE — Progress Notes (Signed)
Subjective: Patient reports condition of neck pain limited dizziness but overall manageable  Objective: Vital signs in last 24 hours: Temp:  [97 F (36.1 C)-98.8 F (37.1 C)] 98.2 F (36.8 C) (03/26 0400) Pulse Rate:  [61-120] 65 (03/26 0700) Resp:  [5-26] 11 (03/26 0700) BP: (106-132)/(52-87) 120/73 (03/26 0700) SpO2:  [93 %-98 %] 96 % (03/26 0700)  Intake/Output from previous day: 03/25 0701 - 03/26 0700 In: 2949 [I.V.:2749; IV Piggyback:200] Out: 3090 [Urine:2990; Blood:100] Intake/Output this shift: No intake/output data recorded.  Awake and alert incision clean dry and intact neurologically intact  Lab Results: Recent Labs    03/20/23 1406  WBC 11.0*  HGB 13.2  HCT 37.6  PLT 213   BMET Recent Labs    03/20/23 1406 03/21/23 0323  NA 140 134*  K 3.8 3.9  CL 111 106  CO2 20* 20*  GLUCOSE 151* 143*  BUN <5* 6  CREATININE 0.82 0.71  CALCIUM 9.2 9.1    Studies/Results: No results found.  Assessment/Plan: Transfer to progressive mobilize with physical Occupational Therapy postop day 1 Chiari decompression  LOS: 1 day     Elaina Hoops 03/21/2023, 7:49 AM

## 2023-03-21 NOTE — Anesthesia Postprocedure Evaluation (Signed)
Anesthesia Post Note  Patient: Leah Stark  Procedure(s) Performed: Suboccipital Craniectomy and Cervical One Laminectomy Cervical Two Laminectomy for Chiari Decompression (Neck)     Patient location during evaluation: PACU Anesthesia Type: General Level of consciousness: awake and alert Pain management: pain level controlled Vital Signs Assessment: post-procedure vital signs reviewed and stable Respiratory status: spontaneous breathing, nonlabored ventilation, respiratory function stable and patient connected to nasal cannula oxygen Cardiovascular status: blood pressure returned to baseline and stable Postop Assessment: no apparent nausea or vomiting Anesthetic complications: no   No notable events documented.  Last Vitals:  Vitals:   03/21/23 1509 03/21/23 1948  BP: 118/76 118/65  Pulse: 74   Resp: 16 11  Temp: 36.6 C 36.9 C  SpO2: 98%     Last Pain:  Vitals:   03/21/23 1948  TempSrc: Oral  PainSc: 3                  Khamora Karan P Saylor Sheckler

## 2023-03-21 NOTE — Plan of Care (Signed)
  Problem: Education: Goal: Knowledge of General Education information will improve Description: Including pain rating scale, medication(s)/side effects and non-pharmacologic comfort measures Outcome: Progressing   Problem: Health Behavior/Discharge Planning: Goal: Ability to manage health-related needs will improve Outcome: Progressing   Problem: Clinical Measurements: Goal: Ability to maintain clinical measurements within normal limits will improve Outcome: Progressing Goal: Will remain free from infection Outcome: Progressing Goal: Diagnostic test results will improve Outcome: Progressing Goal: Respiratory complications will improve Outcome: Progressing Goal: Cardiovascular complication will be avoided Outcome: Progressing   Problem: Activity: Goal: Risk for activity intolerance will decrease Outcome: Progressing   Problem: Nutrition: Goal: Adequate nutrition will be maintained Outcome: Progressing   Problem: Coping: Goal: Level of anxiety will decrease Outcome: Progressing   Problem: Elimination: Goal: Will not experience complications related to bowel motility Outcome: Progressing Goal: Will not experience complications related to urinary retention Outcome: Progressing   Problem: Pain Managment: Goal: General experience of comfort will improve Outcome: Progressing   Problem: Safety: Goal: Ability to remain free from injury will improve Outcome: Progressing   Problem: Skin Integrity: Goal: Risk for impaired skin integrity will decrease Outcome: Progressing   Problem: Education: Goal: Knowledge of the prescribed therapeutic regimen will improve Outcome: Progressing   Problem: Clinical Measurements: Goal: Usual level of consciousness will be regained or maintained. Outcome: Progressing Goal: Neurologic status will improve Outcome: Progressing Goal: Ability to maintain intracranial pressure will improve Outcome: Progressing   Problem: Skin  Integrity: Goal: Demonstration of wound healing without infection will improve Outcome: Progressing   

## 2023-03-21 NOTE — Evaluation (Signed)
Physical Therapy Evaluation Patient Details Name: Leah Stark MRN: [id number] DOB: 2001-02-01 Today's Date: 03/21/2023  History of Present Illness  22 yo female s/p suboccipital craniectomy C1-2 laminectomy with duraplasty for decompression of Chiari malformation. PMH includes anxiety, asthma.  Clinical Impression   Pt presents with headache-type pain, dizziness with mobility (suspect worsened by IV pain medication), impaired balance and decreased activity tolerance vs baseline. Pt to benefit from acute PT to address deficits. Pt ambulated short hallway distance, use of RW for pt security as pt reporting feeling unsteady and dizzy. BP stable vs baseline,112/69, and dizziness not worse as mobility continued. PT anticipates no follow up PT needs post-acutely. PT to progress mobility as tolerated, and will continue to follow acutely.         Recommendations for follow up therapy are one component of a multi-disciplinary discharge planning process, led by the attending physician.  Recommendations may be updated based on patient status, additional functional criteria and insurance authorization.  Follow Up Recommendations       Assistance Recommended at Discharge PRN  Patient can return home with the following  A little help with walking and/or transfers;A little help with bathing/dressing/bathroom    Equipment Recommendations None recommended by PT  Recommendations for Other Services       Functional Status Assessment Patient has had a recent decline in their functional status and demonstrates the ability to make significant improvements in function in a reasonable and predictable amount of time.     Precautions / Restrictions Precautions Precautions: Fall Restrictions Weight Bearing Restrictions: No      Mobility  Bed Mobility Overal bed mobility: Needs Assistance Bed Mobility: Supine to Sit, Sit to Supine     Supine to sit: Min assist Sit to supine: Min assist   General  bed mobility comments: light assist for LE progression to EOB and trunk management, increased time given dizziness suspect due to pain medication as BP stable    Transfers Overall transfer level: Needs assistance Equipment used: Rolling walker (2 wheels) Transfers: Sit to/from Stand Sit to Stand: Min guard           General transfer comment: close guard for safety, stand x3 from EOB.    Ambulation/Gait Ambulation/Gait assistance: Min guard Gait Distance (Feet): 60 Feet Assistive device: Rolling walker (2 wheels) Gait Pattern/deviations: Step-through pattern, Decreased stride length Gait velocity: decr     General Gait Details: close guard for safety, pt holding head rigidly in forward gaze given dizziness and neck pain.  Stairs            Wheelchair Mobility    Modified Rankin (Stroke Patients Only)       Balance Overall balance assessment: Needs assistance Sitting-balance support: No upper extremity supported, Feet supported Sitting balance-Leahy Scale: Fair     Standing balance support: Bilateral upper extremity supported, During functional activity Standing balance-Leahy Scale: Fair Standing balance comment: can stand statically without AD, RW used for pt steadying self given dizziness                             Pertinent Vitals/Pain Pain Assessment Pain Assessment: 0-10 Pain Score: 3  Pain Location: posterior aspect of head Pain Descriptors / Indicators: Sore, Headache Pain Intervention(s): Limited activity within patient's tolerance, Monitored during session, Repositioned    Home Living Family/patient expects to be discharged to:: Private residence Living Arrangements: Spouse/significant other Available Help at Discharge: Family Type of Home: [place]  Access: Stairs to enter   [employer] of Steps: 3 Alternate Level Stairs-Number of Steps: flight Home Layout: Two level Home Equipment: None      Prior Function Prior  Level of Function : Independent/Modified Independent             Mobility Comments: 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 Plans on d/c to family's home, is taking the rest of the semester off       Hand Dominance   Dominant Hand: Right    Extremity/Trunk Assessment   Upper Extremity Assessment Upper Extremity Assessment: Defer to OT evaluation    Lower Extremity Assessment Lower Extremity Assessment: Overall WFL for tasks assessed    Cervical / Trunk Assessment Cervical / Trunk Assessment: Normal  Communication   Communication: No difficulties  Cognition Arousal/Alertness: Awake/alert Behavior During Therapy: WFL for tasks assessed/performed Overall Cognitive Status: Within Functional Limits for tasks assessed                                          General Comments      Exercises     Assessment/Plan    PT Assessment Patient needs continued PT services  PT Problem List Decreased mobility;Decreased activity tolerance;Decreased balance;Decreased knowledge of use of DME;Pain       PT Treatment Interventions DME instruction;Gait training;Therapeutic exercise;Patient/family education;Therapeutic activities;Balance training;Stair training;Functional mobility training;Neuromuscular re-education    PT Goals (Current goals can be found in the Care Plan section)  Acute Rehab PT Goals PT Goal Formulation: With patient Time For Goal Achievement: 04/04/23 Potential to Achieve Goals: Good    Frequency Min 4X/week     Co-evaluation               AM-PAC PT "6 Clicks" Mobility  Outcome Measure Help needed turning from your back to your side while in a flat bed without using bedrails?: None Help needed moving from lying on your back to sitting on the side of a flat bed without using bedrails?: None Help needed moving to and from a bed to a chair (including a wheelchair)?: A Little Help needed standing up from a  chair using your arms (e.g., wheelchair or bedside chair)?: A Little Help needed to walk in hospital room?: A Little Help needed climbing 3-5 steps with a railing? : A Little 6 Click Score: 20    End of Session   Activity Tolerance: Patient tolerated treatment well;Patient limited by fatigue Patient left: in bed;with call bell/phone within reach;with bed alarm set (bed alarm set given dizziness) Nurse Communication: Mobility status PT Visit Diagnosis: Other abnormalities of gait and mobility (R26.89);Muscle weakness (generalized) (M62.81)    Time: [id number] PT Time Calculation (min) (ACUTE ONLY): 25 min   Charges:   PT Evaluation $PT Eval Low Complexity: 1 Low PT Treatments $Therapeutic Activity: 8-22 mins        Stacie Glaze, PT DPT Acute Rehabilitation Services Pager [phone]  Office [phone]   Louis Matte 03/21/2023, 3:34 PM

## 2023-03-22 ENCOUNTER — Other Ambulatory Visit (HOSPITAL_COMMUNITY): Payer: Self-pay

## 2023-03-22 MED ORDER — PREDNISONE 10 MG PO TABS
ORAL_TABLET | ORAL | 0 refills | Status: DC
  Filled 2023-03-22 (×2): qty 30, 12d supply, fill #0

## 2023-03-22 MED ORDER — HYDROCODONE-ACETAMINOPHEN 5-325 MG PO TABS
1.0000 | ORAL_TABLET | ORAL | 0 refills | Status: DC | PRN
  Filled 2023-03-22: qty 18, 3d supply, fill #0
  Filled 2023-03-22: qty 12, 2d supply, fill #1

## 2023-03-22 MED ORDER — METHOCARBAMOL 500 MG PO TABS
750.0000 mg | ORAL_TABLET | Freq: Three times a day (TID) | ORAL | 1 refills | Status: DC | PRN
  Filled 2023-03-22: qty 30, 7d supply, fill #0

## 2023-03-22 NOTE — Progress Notes (Signed)
Physical Therapy Treatment Patient Details Name: Leah Stark MRN: [id number] DOB: [birth year] Today's Date: 03/22/2023   History of Present Illness 22 yo female presented 03/20/23 with with headaches and dizziness and nystagmus;   s/p suboccipital craniectomy C1-2 laminectomy with duraplasty for decompression of Chiari malformation. PMH includes anxiety, asthma.    PT Comments    Patient continues with dizziness and imbalance (she feels slightly worse than she was having pre-op). She required minguard assistance with ambulation with wide base of support and occasional drift to her left with pt able to recover without physical assistance. Provided mother with gait belt and instructed pt should still have someone assist her with ambulation (likely over next several days). Educated that as swelling subsides, balance should improve and hopefully will not need further PT intervention. However, educated re: option of OPPT if symptoms persist when she has follow-up surgery appointment in ~2 weeks (MD can order OPPT at that time).     Recommendations for follow up therapy are one component of a multi-disciplinary discharge planning process, led by the attending physician.  Recommendations may be updated based on patient status, additional functional criteria and insurance authorization.  Follow Up Recommendations       Assistance Recommended at Discharge Intermittent Supervision/Assistance  Patient can return home with the following A little help with walking and/or transfers;A little help with bathing/dressing/bathroom;Help with stairs or ramp for entrance   Equipment Recommendations  None recommended by PT    Recommendations for Other Services       Precautions / Restrictions Precautions Precautions: Fall Precaution Comments: issued gait belt to mother and educated in it's intended use Restrictions Weight Bearing Restrictions: No     Mobility  Bed Mobility Overal bed mobility: Needs  Assistance Bed Mobility: Supine to Sit, Sit to Supine     Supine to sit: Modified independent (Device/Increase time) Sit to supine: Modified independent (Device/Increase time)        Transfers Overall transfer level: Needs assistance Equipment used: None Transfers: Sit to/from Stand Sit to Stand: Min guard           General transfer comment: close guard for safety, no imbalance    Ambulation/Gait Ambulation/Gait assistance: Min guard Gait Distance (Feet): 180 Feet Assistive device: None Gait Pattern/deviations: Step-through pattern, Decreased stride length, Drifts right/left Gait velocity: decr     General Gait Details: close guard for safety; pt with occasional drift (to left) with indedpendent recovery   Stairs Stairs:  (discussed using rail and having someone with her initially)           Wheelchair Mobility    Modified Rankin (Stroke Patients Only)       Balance Overall balance assessment: Needs assistance Sitting-balance support: No upper extremity supported, Feet supported Sitting balance-Leahy Scale: Fair     Standing balance support: Bilateral upper extremity supported, During functional activity Standing balance-Leahy Scale: Fair Standing balance comment: can stand statically without AD,                            Cognition Arousal/Alertness: Awake/alert Behavior During Therapy: WFL for tasks assessed/performed Overall Cognitive Status: Within Functional Limits for tasks assessed                                          Exercises      General Comments  Pertinent Vitals/Pain Pain Assessment Pain Assessment: 0-10 Pain Score: 4  Pain Location: posterior aspect of head Pain Descriptors / Indicators: Sore, Headache Pain Intervention(s): Limited activity within patient's tolerance, Monitored during session, Premedicated before session    Home Living                          Prior  Function            PT Goals (current goals can now be found in the care plan section) Acute Rehab PT Goals Patient Stated Goal: feel better Time For Goal Achievement: 04/04/23 Potential to Achieve Goals: Good Progress towards PT goals: Progressing toward goals    Frequency    Min 4X/week      PT Plan Current plan remains appropriate    Co-evaluation              AM-PAC PT "6 Clicks" Mobility   Outcome Measure  Help needed turning from your back to your side while in a flat bed without using bedrails?: None Help needed moving from lying on your back to sitting on the side of a flat bed without using bedrails?: None Help needed moving to and from a bed to a chair (including a wheelchair)?: A Little Help needed standing up from a chair using your arms (e.g., wheelchair or bedside chair)?: A Little Help needed to walk in hospital room?: A Little Help needed climbing 3-5 steps with a railing? : A Little 6 Click Score: 20    End of Session Equipment Utilized During Treatment: Gait belt Activity Tolerance: Patient tolerated treatment well Patient left: in bed;with call bell/phone within reach;with family/visitor present   PT Visit Diagnosis: Other abnormalities of gait and mobility (R26.89);Muscle weakness (generalized) (M62.81)     Time: [id number] PT Time Calculation (min) (ACUTE ONLY): 18 min  Charges:  $Gait Training: 8-22 mins                      [place]  Office [phone]    Rexanne Mano 03/22/2023, 9:48 AM

## 2023-03-22 NOTE — Evaluation (Signed)
Occupational Therapy Evaluation Patient Details Name: Leah Stark MRN: [id number] DOB: 2001-02-23 Today's Date: 03/22/2023   History of Present Illness 22 yo female presented 03/20/23 with with headaches and dizziness and nystagmus;   s/p suboccipital craniectomy C1-2 laminectomy with duraplasty for decompression of Chiari malformation. PMH includes anxiety, asthma.   Clinical Impression   Patient evaluated by Occupational Therapy with no further acute OT needs identified. All education has been completed and the patient has no further questions. Prior to admit, pt was independent with all ADL tasks and functional mobility. Pt has strong family support at home who will be able to provide assistance PRN. No follow up OT services or equipment recommended at this time. OT is signing off. Thank you for this referral.       Recommendations for follow up therapy are one component of a multi-disciplinary discharge planning process, led by the attending physician.  Recommendations may be updated based on patient status, additional functional criteria and insurance authorization.   Assistance Recommended at Discharge PRN  Patient can return home with the following Assist for transportation;Assistance with cooking/housework    Functional Status Assessment  Patient has had a recent decline in their functional status and demonstrates the ability to make significant improvements in function in a reasonable and predictable amount of time.  Equipment Recommendations  None recommended by OT       Precautions / Restrictions Precautions Precautions: Fall;Cervical Precaution Booklet Issued: Yes (comment) Precaution Comments: Verbal education provided on cervical precautions and ADL modifications Restrictions Weight Bearing Restrictions: No      Mobility Bed Mobility Overal bed mobility: Modified Independent Bed Mobility: Supine to Sit, Sit to Supine     Supine to sit: Modified independent  (Device/Increase time) Sit to supine: Modified independent (Device/Increase time)   General bed mobility comments: Education provided on log rolling to get in and out of bed to decrease amount of strain placed on neck and may decrease intensity of neck pain. Patient Response: Cooperative  Transfers Overall transfer level: Needs assistance Equipment used: None Transfers: Sit to/from Stand, Bed to chair/wheelchair/BSC Sit to Stand: Min guard     Step pivot transfers: Min guard     General transfer comment: close guard for safety, no imbalance      Balance Overall balance assessment: No apparent balance deficits (not formally assessed)       ADL either performed or assessed with clinical judgement   ADL Overall ADL's : Modified independent;At baseline             Vision Baseline Vision/History: 0 No visual deficits Ability to See in Adequate Light: 0 Adequate Patient Visual Report: No change from baseline Vision Assessment?: No apparent visual deficits            Pertinent Vitals/Pain Pain Assessment Pain Assessment: 0-10 Pain Score: 5  Pain Location: posterior aspect of head Pain Descriptors / Indicators: Sore, Headache Pain Intervention(s): Limited activity within patient's tolerance, Monitored during session     Hand Dominance Right   Extremity/Trunk Assessment Upper Extremity Assessment Upper Extremity Assessment: Overall WFL for tasks assessed   Lower Extremity Assessment Lower Extremity Assessment: Defer to PT evaluation   Cervical / Trunk Assessment Cervical / Trunk Assessment: Neck Surgery   Communication Communication Communication: No difficulties   Cognition Arousal/Alertness: Awake/alert Behavior During Therapy: WFL for tasks assessed/performed Overall Cognitive Status: Within Functional Limits for tasks assessed       General Comments  VSS on RA  Home Living Family/patient expects to be discharged to:: Private  residence Living Arrangements: Spouse/significant other Available Help at Discharge: Family Type of Home: House Home Access: Stairs to enter [employer] of Steps: 3   Home Layout: Two level Alternate Level Stairs-Number of Steps: flight   Bathroom Shower/Tub: Teacher, early years/pre: Standard     Home Equipment: None          Prior Functioning/Environment Prior Level of Function : Independent/Modified Independent             Mobility Comments: 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 Plans on d/c to family's home, is taking the rest of the semester off          OT Problem List: Impaired balance (sitting and/or standing)      OT Treatment/Interventions:   Eval only   OT Goals(Current goals can be found in the care plan section) Acute Rehab OT Goals Patient Stated Goal: to return to bed  OT Frequency:  1X visit       AM-PAC OT "6 Clicks" Daily Activity     Outcome Measure Help from another person eating meals?: None Help from another person taking care of personal grooming?: None Help from another person toileting, which includes using toliet, bedpan, or urinal?: None Help from another person bathing (including washing, rinsing, drying)?: None Help from another person to put on and taking off regular upper body clothing?: None Help from another person to put on and taking off regular lower body clothing?: None 6 Click Score: 24   End of Session Equipment Utilized During Treatment: Gait belt  Activity Tolerance: Patient tolerated treatment well Patient left: in bed;with call bell/phone within reach;with family/visitor present  OT Visit Diagnosis: Muscle weakness (generalized) (M62.81)                Time: [id number] OT Time Calculation (min): 19 min Charges:  OT General Charges $OT Visit: 1 Visit OT Evaluation $OT Eval Low Complexity: 1 Low  Ailene Ravel, OTR/L,CBIS  Supplemental OT - [facility] and  [name] Secure Chat Preferred    Soila Printup, Clarene Duke 03/22/2023, 1:55 PM

## 2023-03-22 NOTE — Discharge Summary (Signed)
  Physician Discharge Summary  Patient ID: Leah Stark MRN: [id number] DOB/AGE: 2001/11/21 22 y.o. Estimated body mass index is 25.82 kg/m as calculated from the following:   Height as of this encounter: 5\' 6"  (1.676 m).   Weight as of this encounter: 72.6 kg.   Admit date: 03/20/2023 Discharge date: 03/22/2023  Admission Diagnoses: Chiari I malformation  Discharge Diagnoses: Same Principal Problem:   Status post craniectomy Active Problems:   Chiari I malformation [facility])   Discharged Condition: good  Hospital Course: Patient admitted to hospital underwent suboccipital craniectomy and C1-C2 laminectomy for decompression of Chiari I malformation.  Postoperative patient did very well went to recovery room in the ICU was observed in the ICU overnight transferred to the stepdown unit and is stable for discharge home on postop day 2.  Patient will be discharged on a steroid taper as well as pain medication muscle relaxers.  Patient is to resume previous medications as normal.  Patient was set up schedule follow-up in 1 to 2 weeks.  Consults: Significant Diagnostic Studies: Treatments: Suboccipital craniectomy C1-C2 laminectomy Discharge Exam: Blood pressure 120/74, pulse (!) 106, temperature 98.4 F (36.9 C), temperature source Oral, resp. rate 19, height 5\' 6"  (1.676 m), weight 72.6 kg, last menstrual period 02/28/2023, SpO2 97 %. Awake alert neurologically intact  Disposition: Home   Allergies as of 03/22/2023       Reactions   Cephalexin Rash   Tolerated Ancef 03/20/2023        Medication List     TAKE these medications    albuterol 108 (90 Base) MCG/ACT inhaler Commonly known as: VENTOLIN HFA Inhale 2 puffs into the lungs every 6 (six) hours as needed for wheezing or shortness of breath.   HYDROcodone-acetaminophen 5-325 MG tablet Commonly known as: NORCO/VICODIN Take 1 tablet by mouth every 4 (four) hours as needed for moderate pain.   Magnesium Oxide -Mg  Supplement 500 MG Tabs Take 500 mg by mouth at bedtime.   methocarbamol 500 MG tablet Commonly known as: ROBAXIN Take 1.5 tablets (750 mg total) by mouth every 8 (eight) hours as needed for muscle spasms.   predniSONE 10 MG (21) Tbpk tablet Commonly known as: STERAPRED UNI-PAK 21 TAB 4 pills p.o. daily x 3 days, 3 pills p.o. daily x 3 days, 2 pills p.o. daily x 3 days and 1 pill P.o. daily x 3 days   riboflavin 100 MG Tabs tablet Commonly known as: VITAMIN B-2 Take 100 mg by mouth at bedtime.   topiramate 25 MG tablet Commonly known as: TOPAMAX Take 1 tablet (25 mg total) by mouth 2 (two) times daily.         Signed: Elaina Hoops 03/22/2023, 12:26 PM

## 2023-05-19 ENCOUNTER — Other Ambulatory Visit (HOSPITAL_COMMUNITY): Payer: Self-pay | Admitting: Student

## 2023-05-19 DIAGNOSIS — G935 Compression of brain: Secondary | ICD-10-CM

## 2023-05-21 ENCOUNTER — Ambulatory Visit (HOSPITAL_COMMUNITY)
Admission: RE | Admit: 2023-05-21 | Discharge: 2023-05-21 | Disposition: A | Discharge status: Home / Self Care | Payer: 59 | Source: Ambulatory Visit | Attending: Student | Admitting: Student

## 2023-05-21 DIAGNOSIS — G935 Compression of brain: Secondary | ICD-10-CM | POA: Diagnosis present

## 2023-05-26 ENCOUNTER — Other Ambulatory Visit (INDEPENDENT_AMBULATORY_CARE_PROVIDER_SITE_OTHER): Payer: Self-pay | Admitting: Neurology

## 2023-05-26 DIAGNOSIS — R519 Headache, unspecified: Secondary | ICD-10-CM

## 2023-05-29 ENCOUNTER — Telehealth (INDEPENDENT_AMBULATORY_CARE_PROVIDER_SITE_OTHER): Payer: Self-pay | Admitting: Neurology

## 2023-05-29 ENCOUNTER — Other Ambulatory Visit (INDEPENDENT_AMBULATORY_CARE_PROVIDER_SITE_OTHER): Payer: Self-pay

## 2023-05-29 DIAGNOSIS — R519 Headache, unspecified: Secondary | ICD-10-CM

## 2023-05-29 NOTE — Telephone Encounter (Signed)
Received via Email (as discussed):  Hey,  Patient called in and stated that her Topamax was declined from your office. She is not sure if it is because she hasn't been there since she has been here. We have never given her this medication. So Dr. Wynetta Emery would like to know if your office is going to continue her on this medication?     Talbert Nan Certified Medical Assistant [facility]

## 2023-05-29 NOTE — Telephone Encounter (Signed)
Who's calling (name and relationship to patient) : Lurena Joiner- Dr. Dola Argyle office- [facility]  Best contact number: [phone] Ext [phone]  Provider they see: Dr. Merri Brunette  Reason for call: Lurena Joiner from [facility] Dr. Wynetta Emery office calling about pts medication Topamax, she said pt is a couple months out from having surgery and they are trying to figure out about medication. Pt told them that Dr. Merri Brunette no longer wants to prescribe the medication for her and they wanted to know if that was true and if so they were going to take over so pt can wean off of it. Please call Lurena Joiner with clarification on medication.    Call ID: Lurena Joiner [phone] Ext [phone]     PRESCRIPTION REFILL ONLY  Name of prescription:  Pharmacy:

## 2023-05-29 NOTE — Telephone Encounter (Signed)
Email sent to Lurena Joiner (Adult, Neuro office) as follows:   Return in about 5 months (around 01/01/2023). Since the headaches and particularly dizziness are getting worse with some balance issues, we will schedule for a brain MRI to rule out any structural abnormality Continue the same dose of Topamax at 25 mg twice daily Continue taking dietary supplements Continue with more hydration and slight increase salt intake Have some pauses between position change like from lying to sitting and then standing Return in 5 months for follow-up visit  On 10-03-2022 when discussing Brain MRI adult neurosurgery appointment discussed (with need to follow up their).  12-28-2022 Patient cancelled appt (with no reschedule).  Last Rx refill request: 05-26-2023 (Denied by provider). To establish and maintain adult provider or follow up here once more for bridge Rx.  B. Roten CMA

## 2023-05-29 NOTE — Telephone Encounter (Signed)
Patient needs appt/follow-up and will send Topomax (refill) request once we have made follow up visit with Dr. Devonne Doughty confirmed for his review/approval.  B. Roten CMA

## 2023-05-30 ENCOUNTER — Ambulatory Visit (INDEPENDENT_AMBULATORY_CARE_PROVIDER_SITE_OTHER): Payer: 59 | Admitting: Neurology

## 2023-05-30 ENCOUNTER — Encounter (INDEPENDENT_AMBULATORY_CARE_PROVIDER_SITE_OTHER): Payer: Self-pay | Admitting: Neurology

## 2023-05-30 ENCOUNTER — Encounter (INDEPENDENT_AMBULATORY_CARE_PROVIDER_SITE_OTHER): Payer: Self-pay

## 2023-05-30 VITALS — BP 102/64 | HR 76 | Ht 66.02 in | Wt 155.9 lb

## 2023-05-30 DIAGNOSIS — G935 Compression of brain: Secondary | ICD-10-CM | POA: Diagnosis not present

## 2023-05-30 DIAGNOSIS — G43109 Migraine with aura, not intractable, without status migrainosus: Secondary | ICD-10-CM

## 2023-05-30 DIAGNOSIS — R519 Headache, unspecified: Secondary | ICD-10-CM

## 2023-05-30 MED ORDER — TOPIRAMATE 25 MG PO TABS: 25.0000 mg | ORAL_TABLET | Freq: Two times a day (BID) | ORAL | 2 refills | Status: AC

## 2023-05-30 NOTE — Progress Notes (Signed)
Patient: Leah Stark MRN: [id number] Sex: female DOB: 2001/10/14  Provider: Keturah Shavers, MD Location of Care: [facility]  Note type: Routine return visit  Referral Source: Diamantina Monks, MD History from:  Patient Chief Complaint: Follow up Migraines and Dizziness  History of Present Illness: Leah Stark is a 22 y.o. female is here for follow-up management of headaches. She has a diagnosis of primary type headaches including migraine and tension type headaches for which she was on Topamax with good headache control but on her last visit since she was having more episodes of headache without any improvement and more dizziness and fainting episodes, she was scheduled for a brain MRI which showed Chiari malformation so she was referred to neurosurgery for evaluation and possible repair. She was seen by Dr. Wynetta Emery and had surgery in March 2024 and has had a few follow-up visit with neurosurgery.  She is still having some inflammation around her incision with some neck pain and limitation of head movements to the sides but overall she thinks that she is doing slightly better in terms of headache intensity and frequency. She has been on low-dose Topamax at 25 mg twice daily which she has been taking regularly without any missing doses although she is still having on average 2 or 3 headaches each week that some of them needed OTC medications.  She does not have any nausea or vomiting with the headaches although she is still having some dizzy spells but it is less than before. She will have some difficulty sleeping at night due to having neck pain and occasional headaches and she does have Robaxin as muscle relaxant to take   Review of Systems: Review of system as per HPI, otherwise negative.  Past Medical History:  Diagnosis Date   Anxiety    Asthma    Headache    Hospitalizations: Yes, March 25th to 27th [facility]) Head Injury: No., Nervous System Infections: No.,  Immunizations up to date: Yes.     Surgical History Past Surgical History:  Procedure Laterality Date   MULTIPLE TOOTH EXTRACTIONS     2 teeth   RADIOLOGY WITH ANESTHESIA N/A 02/07/2023   Procedure: MRI CERVICAL AND MRI [facility] WITHOUT CONTRAST WITH ANESTHESIA;  Surgeon: Radiologist, Medication, MD;  Location: [facility] OR;  Service: Radiology;  Laterality: N/A;   SUBOCCIPITAL CRANIECTOMY CERVICAL LAMINECTOMY N/A 03/20/2023   Procedure: Suboccipital Craniectomy and Cervical One Laminectomy Cervical Two Laminectomy for Chiari Decompression;  Surgeon: Donalee Citrin, MD;  Location[facility] OR;  Service: Neurosurgery;  Laterality: N/A;    Family History family history includes Heart Problems in her paternal grandmother; Heart attack in her maternal grandfather.   Social History Social History   Socioeconomic History   Marital status: Single    Spouse name: Not on file   Number of children: Not on file   Years of education: Not on file   Highest education level: Not on file  Occupational History   Not on file  Tobacco Use   Smoking status: Never    Passive exposure: Never   Smokeless tobacco: Never  Vaping Use   Vaping Use: Never used  Substance and Sexual Activity   Alcohol use: Never   Drug use: Never   Sexual activity: Never  Other Topics Concern   Not on file  Social History Narrative   In [place], [facility].    Social Determinants of Health   Financial Resource Strain: Not on file  Food Insecurity: Not on file  Transportation Needs: Not on file  Physical Activity: Not on file  Stress: Not on file  Social Connections: Not on file     Allergies  Allergen Reactions   Cephalexin Rash    Tolerated Ancef 03/20/2023    Physical Exam BP 102/64   Pulse 76   Ht 5' 6.02" (1.677 m)   Wt 155 lb 13.8 oz (70.7 kg)   LMP 05/04/2023 (Exact Date)   BMI 25.14 kg/m  Gen: Awake, alert, not in distress Skin: No rash, No neurocutaneous stigmata. HEENT: Normocephalic, no dysmorphic  features, no conjunctival injection, nares patent, mucous membranes moist, oropharynx clear. Neck: Supple, no meningismus. No focal tenderness. Resp: Clear to auscultation bilaterally CV: Regular rate, normal S1/S2, no murmurs, no rubs Abd: BS present, abdomen soft, non-tender, non-distended. No hepatosplenomegaly or mass Ext: Warm and well-perfused. No deformities, no muscle wasting, ROM full.  Neurological Examination: MS: Awake, alert, interactive. Normal eye contact, answered the questions appropriately, speech was fluent,  Normal comprehension.  Attention and concentration were normal. Cranial Nerves: Pupils were equal and reactive to light ( 5-26mm);  normal fundoscopic exam with sharp discs, visual field full with confrontation test; EOM normal, no nystagmus; no ptsosis, no double vision, intact facial sensation, face symmetric with full strength of facial muscles, hearing intact to finger rub bilaterally, palate elevation is symmetric, tongue protrusion is symmetric with full movement to both sides.  Sternocleidomastoid and trapezius are with normal strength. Tone-Normal Strength-Normal strength in all muscle groups DTRs-  Biceps Triceps Brachioradialis Patellar Ankle  R 2+ 2+ 2+ 2+ 2+  L 2+ 2+ 2+ 2+ 2+   Plantar responses flexor bilaterally, no clonus noted Sensation: Intact to light touch, temperature, vibration, Romberg negative. Coordination: No dysmetria on FTN test. No difficulty with balance. Gait: Normal walk and run. Tandem gait was normal. Was able to perform toe walking and heel walking without difficulty.   Assessment and Plan 1. Basilar migraine   2. Mild headache   3. 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  She has had a slight improvement of the headaches but still having fairly  frequent headaches and some neck pain and some area of inflammation around the incision. I discussed with patient and her mother that I would recommend to increase the dose of Topamax to 50 mg twice daily to help with the headache and also she may benefit from starting small dose of muscle relaxant such as Flexeril although since she is taking Robaxin at this time I would not recommend adding another muscle relaxant for now. Mother would like to continue the same dose of Topamax at 25 mg twice daily for now so I will send a prescription for the same dose but if she develops more frequent headaches then she will call my office to increase the dose of medication. She needs to continue with more hydration with adequate sleep and limited screen time She will continue follow-up with neurosurgery to manage her postop recovery I would like to see her in 8 months for follow-up visit or sooner if she develops more frequent headaches.  She and her mother understood and agreed with the plan.  Meds ordered this encounter  Medications   topiramate (TOPAMAX) 25 MG tablet    Sig: Take 1 tablet (25 mg total) by mouth  2 (two) times daily.    Dispense:  180 tablet    Refill:  2   No orders of the defined types were placed in this encounter.

## 2023-05-30 NOTE — Telephone Encounter (Signed)
Leah Stark called back in stating she is completely out of the Topamax. Leah Stark confirmed the pharmacy as [facility]-[address]. Leah Stark is scheduled to come 6/4 at 3:30.

## 2023-05-30 NOTE — Patient Instructions (Signed)
Continue with the same dose of Topamax at 25 mg twice daily If continues with more headaches, call the office to increase the dose of medication to 50 mg twice daily May take some muscle relaxant to help with the headache and neck pain Continue follow-up regularly with neurosurgeon Return in 8 months for follow-up visit

## 2023-05-30 NOTE — Telephone Encounter (Signed)
No return call from patient to schedule previous canceled/missed follow up. Left message 05-29-2023 with parent.  Rx denied this time (as per previous advise by MD) due to no contact.  B. Roten CMA  Note: If patient reschedules please have them send request to Dr. Devonne Doughty to refill pending follow up.

## 2023-07-06 ENCOUNTER — Other Ambulatory Visit (HOSPITAL_COMMUNITY): Payer: Self-pay | Admitting: Neurosurgery

## 2023-07-06 DIAGNOSIS — G935 Compression of brain: Secondary | ICD-10-CM

## 2023-07-08 ENCOUNTER — Ambulatory Visit (HOSPITAL_COMMUNITY)
Admission: RE | Admit: 2023-07-08 | Discharge: 2023-07-08 | Disposition: A | Discharge status: Home / Self Care | Payer: 59 | Source: Ambulatory Visit | Attending: Neurosurgery | Admitting: Neurosurgery

## 2023-07-08 DIAGNOSIS — G935 Compression of brain: Secondary | ICD-10-CM | POA: Insufficient documentation

## 2023-07-18 ENCOUNTER — Other Ambulatory Visit: Payer: Self-pay | Admitting: Neurosurgery

## 2023-07-21 ENCOUNTER — Other Ambulatory Visit: Payer: Self-pay

## 2023-07-21 ENCOUNTER — Encounter (HOSPITAL_COMMUNITY): Payer: Self-pay | Admitting: Neurosurgery

## 2023-07-21 NOTE — Progress Notes (Signed)
SDW CALL  Patient was given pre-op instructions over the phone. The opportunity was given for the patient to ask questions. No further questions asked. Patient verbalized understanding of instructions given.   PCP - Sneha Raju,MD Cardiologist - denies Neurologist - Eulas Post  PPM/ICD - denies Device Orders -  Rep Notified -   Chest x-ray - na EKG - none Stress Test - none ECHO - none Cardiac Cath - none  Sleep Study - no CPAP -   Fasting Blood Sugar - na Checks Blood Sugar _____ times a day  Blood Thinner Instructions:na Aspirin Instructions:na  ERAS Protcol -no PRE-SURGERY Ensure or G2-   COVID TEST- na   Anesthesia review: no  Patient denies shortness of breath, fever, cough and chest pain over the phone call    Surgical Instructions    Your procedure is scheduled on July 29  Report to [facility] Main Entrance "A" at 3:45 P.M., then check in with the Admitting office.  Call this number if you have problems the morning of surgery:  [phone]   Remember:  Do not eat or drink anything after midnight the night before your surgery   Take these medicines the morning of surgery with A SIP OF WATER:  Prozac,Topamax  PRN- Tylenol,Albuterol- bring inhaler to hospital  As of today, STOP taking any Aspirin (unless otherwise instructed by your surgeon) Aleve, Naproxen, Ibuprofen, Motrin, Advil, Goody's, BC's, all herbal medications, fish oil, and all vitamins.  [facility] is not responsible for any belongings or valuables. .   Do NOT Smoke (Tobacco/Vaping)  24 hours prior to your procedure  If you use a CPAP at night, you may bring your mask for your overnight stay.   Contacts, glasses, hearing aids, dentures or partials may not be worn into surgery, please bring cases for these belongings   Patients discharged the day of surgery will not be allowed to drive home, and someone needs to stay with them for 24 hours.   Special instructions:    Oral  Hygiene is also important to reduce your risk of infection.  Remember - BRUSH YOUR TEETH THE MORNING OF SURGERY WITH YOUR REGULAR TOOTHPASTE   Day of Surgery:  Take a shower the day of or night before with antibacterial soap. Wear Clean/Comfortable clothing the morning of surgery Do not apply any deodorants/lotions.   Do not wear jewelry or makeup Do not wear lotions, powders, perfumes/colognes, or deodorant. Do not shave 48 hours prior to surgery.  Men may shave face and neck. Do not bring valuables to the hospital. Do not wear nail polish, gel polish, artificial nails, or any other type of covering on natural nails (fingers and toes) If you have artificial nails or gel coating that need to be removed by a nail salon, please have this removed prior to surgery. Artificial nails or gel coating may interfere with anesthesia's ability to adequately monitor your vital signs. Remember to brush your teeth WITH YOUR REGULAR TOOTHPASTE.

## 2023-07-24 ENCOUNTER — Inpatient Hospital Stay (HOSPITAL_COMMUNITY)
Admission: RE | Admit: 2023-07-24 | Discharge: 2023-07-26 | DRG: 026 | Disposition: A | Discharge status: Home / Self Care | Payer: 59 | Attending: Neurosurgery | Admitting: Neurosurgery

## 2023-07-24 ENCOUNTER — Inpatient Hospital Stay (HOSPITAL_COMMUNITY): Payer: 59 | Admitting: Anesthesiology

## 2023-07-24 ENCOUNTER — Inpatient Hospital Stay (HOSPITAL_COMMUNITY)
Admission: RE | Disposition: A | Discharge status: Home / Self Care | Payer: Self-pay | Source: Home / Self Care | Attending: Neurosurgery

## 2023-07-24 ENCOUNTER — Encounter (HOSPITAL_COMMUNITY): Payer: Self-pay | Admitting: Neurosurgery

## 2023-07-24 ENCOUNTER — Other Ambulatory Visit: Payer: Self-pay

## 2023-07-24 DIAGNOSIS — Z79899 Other long term (current) drug therapy: Secondary | ICD-10-CM | POA: Diagnosis not present

## 2023-07-24 DIAGNOSIS — G935 Compression of brain: Secondary | ICD-10-CM | POA: Diagnosis not present

## 2023-07-24 DIAGNOSIS — Z8249 Family history of ischemic heart disease and other diseases of the circulatory system: Secondary | ICD-10-CM | POA: Diagnosis not present

## 2023-07-24 DIAGNOSIS — M62838 Other muscle spasm: Secondary | ICD-10-CM | POA: Diagnosis present

## 2023-07-24 DIAGNOSIS — F419 Anxiety disorder, unspecified: Secondary | ICD-10-CM | POA: Diagnosis present

## 2023-07-24 DIAGNOSIS — J45909 Unspecified asthma, uncomplicated: Secondary | ICD-10-CM | POA: Diagnosis present

## 2023-07-24 DIAGNOSIS — G9782 Other postprocedural complications and disorders of nervous system: Principal | ICD-10-CM | POA: Diagnosis present

## 2023-07-24 DIAGNOSIS — E872 Acidosis, unspecified: Secondary | ICD-10-CM | POA: Diagnosis present

## 2023-07-24 DIAGNOSIS — Z888 Allergy status to other drugs, medicaments and biological substances status: Secondary | ICD-10-CM | POA: Diagnosis not present

## 2023-07-24 DIAGNOSIS — G9600 Cerebrospinal fluid leak, unspecified: Secondary | ICD-10-CM | POA: Diagnosis present

## 2023-07-24 DIAGNOSIS — G96198 Other disorders of meninges, not elsewhere classified: Secondary | ICD-10-CM | POA: Diagnosis present

## 2023-07-24 HISTORY — PX: SUBOCCIPITAL CRANIECTOMY CERVICAL LAMINECTOMY: SHX5404

## 2023-07-24 HISTORY — DX: Other specified postprocedural states: R11.2

## 2023-07-24 HISTORY — DX: Other specified postprocedural states: Z98.890

## 2023-07-24 LAB — CBC
HCT: 41 % (ref 36.0–46.0)
Hemoglobin: 14.1 g/dL (ref 12.0–15.0)
MCH: 29.2 pg (ref 26.0–34.0)
MCHC: 34.4 g/dL (ref 30.0–36.0)
MCV: 84.9 fL (ref 80.0–100.0)
Platelets: 237 10*3/uL (ref 150–400)
RBC: 4.83 MIL/uL (ref 3.87–5.11)
RDW: 11.1 % — ABNORMAL LOW (ref 11.5–15.5)
WBC: 8.3 10*3/uL (ref 4.0–10.5)
nRBC: 0 % (ref 0.0–0.2)

## 2023-07-24 LAB — BASIC METABOLIC PANEL
Anion gap: 10 (ref 5–15)
BUN: 8 mg/dL (ref 6–20)
CO2: 17 mmol/L — ABNORMAL LOW (ref 22–32)
Calcium: 9.3 mg/dL (ref 8.9–10.3)
Chloride: 108 mmol/L (ref 98–111)
Creatinine, Ser: 0.88 mg/dL (ref 0.44–1.00)
GFR, Estimated: >60 mL/min (ref >60)
Glucose, Bld: 87 mg/dL (ref 70–99)
Potassium: 3.4 mmol/L — ABNORMAL LOW (ref 3.5–5.1)
Sodium: 135 mmol/L (ref 135–145)

## 2023-07-24 LAB — POCT PREGNANCY, URINE: Preg Test, Ur: NEGATIVE

## 2023-07-24 SURGERY — SUBOCCIPITAL CRANIECTOMY CERVICAL LAMINECTOMY/DURAPLASTY
Anesthesia: General | Site: Head

## 2023-07-24 MED ORDER — POTASSIUM CHLORIDE IN NACL 20-0.9 MEQ/L-% IV SOLN
INTRAVENOUS | Status: DC
  Filled 2023-07-24 (×3): qty 1000

## 2023-07-24 MED ORDER — FENTANYL CITRATE (PF) 250 MCG/5ML IJ SOLN
INTRAMUSCULAR | Status: DC | PRN
  Administered 2023-07-24: 50 ug via INTRAVENOUS
  Administered 2023-07-24 (×2): 100 ug via INTRAVENOUS

## 2023-07-24 MED ORDER — MIDAZOLAM HCL 2 MG/2ML IJ SOLN
INTRAMUSCULAR | Status: AC
  Filled 2023-07-24: qty 2

## 2023-07-24 MED ORDER — 0.9 % SODIUM CHLORIDE (POUR BTL) OPTIME
TOPICAL | Status: DC | PRN
  Administered 2023-07-24: 1000 mL

## 2023-07-24 MED ORDER — ONDANSETRON HCL 4 MG/2ML IJ SOLN: 4.0000 mg | INTRAMUSCULAR | Status: DC | PRN

## 2023-07-24 MED ORDER — LIDOCAINE 2% (20 MG/ML) 5 ML SYRINGE
INTRAMUSCULAR | Status: AC
  Filled 2023-07-24: qty 5

## 2023-07-24 MED ORDER — FLUOXETINE HCL 10 MG PO CAPS
10.0000 mg | ORAL_CAPSULE | Freq: Every day | ORAL | Status: DC
  Administered 2023-07-25: 10 mg via ORAL
  Filled 2023-07-24 (×3): qty 1

## 2023-07-24 MED ORDER — VANCOMYCIN HCL IN DEXTROSE 1-5 GM/200ML-% IV SOLN
INTRAVENOUS | Status: AC
  Filled 2023-07-24: qty 200

## 2023-07-24 MED ORDER — THROMBIN 5000 UNITS EX SOLR
CUTANEOUS | Status: AC
  Filled 2023-07-24: qty 10000

## 2023-07-24 MED ORDER — LIDOCAINE-EPINEPHRINE 1 %-1:100000 IJ SOLN
INTRAMUSCULAR | Status: AC
  Filled 2023-07-24: qty 1

## 2023-07-24 MED ORDER — BACITRACIN ZINC 500 UNIT/GM EX OINT
TOPICAL_OINTMENT | CUTANEOUS | Status: AC
  Filled 2023-07-24: qty 28.35

## 2023-07-24 MED ORDER — ADHERUS DURAL SEALANT
PACK | TOPICAL | Status: DC | PRN
  Administered 2023-07-24: 1 via TOPICAL

## 2023-07-24 MED ORDER — LACTATED RINGERS IV SOLN: INTRAVENOUS | Status: DC

## 2023-07-24 MED ORDER — EPHEDRINE SULFATE-NACL 50-0.9 MG/10ML-% IV SOSY
PREFILLED_SYRINGE | INTRAVENOUS | Status: DC | PRN
  Administered 2023-07-24: 10 mg via INTRAVENOUS
  Administered 2023-07-24 (×2): 5 mg via INTRAVENOUS

## 2023-07-24 MED ORDER — FENTANYL CITRATE (PF) 100 MCG/2ML IJ SOLN
25.0000 ug | INTRAMUSCULAR | Status: DC | PRN
  Administered 2023-07-24 (×2): 50 ug via INTRAVENOUS

## 2023-07-24 MED ORDER — ORAL CARE MOUTH RINSE: 15.0000 mL | Freq: Once | OROMUCOSAL | Status: AC

## 2023-07-24 MED ORDER — MAGNESIUM OXIDE -MG SUPPLEMENT 400 (240 MG) MG PO TABS
400.0000 mg | ORAL_TABLET | Freq: Every day | ORAL | Status: DC
  Administered 2023-07-25: 400 mg via ORAL
  Filled 2023-07-24: qty 1

## 2023-07-24 MED ORDER — BUPIVACAINE HCL (PF) 0.25 % IJ SOLN
INTRAMUSCULAR | Status: DC | PRN
  Administered 2023-07-24: 10 mL

## 2023-07-24 MED ORDER — ONDANSETRON HCL 4 MG/2ML IJ SOLN
INTRAMUSCULAR | Status: DC | PRN
  Administered 2023-07-24: 4 mg via INTRAVENOUS

## 2023-07-24 MED ORDER — LIDOCAINE-EPINEPHRINE 1 %-1:100000 IJ SOLN
INTRAMUSCULAR | Status: DC | PRN
  Administered 2023-07-24: 14 mL via INTRADERMAL

## 2023-07-24 MED ORDER — BACITRACIN ZINC 500 UNIT/GM EX OINT
TOPICAL_OINTMENT | CUTANEOUS | Status: DC | PRN
  Administered 2023-07-24: 1 via TOPICAL

## 2023-07-24 MED ORDER — VANCOMYCIN HCL 1000 MG IV SOLR
INTRAVENOUS | Status: DC | PRN
  Administered 2023-07-24: 1000 mg via INTRAVENOUS

## 2023-07-24 MED ORDER — IBUPROFEN 200 MG PO TABS: 800.0000 mg | ORAL_TABLET | Freq: Four times a day (QID) | ORAL | Status: DC | PRN

## 2023-07-24 MED ORDER — CEFAZOLIN SODIUM 1 G IJ SOLR
INTRAMUSCULAR | Status: AC
  Filled 2023-07-24: qty 30

## 2023-07-24 MED ORDER — ACETAMINOPHEN 10 MG/ML IV SOLN
1000.0000 mg | Freq: Once | INTRAVENOUS | Status: DC | PRN
  Administered 2023-07-24: 1000 mg via INTRAVENOUS

## 2023-07-24 MED ORDER — SCOPOLAMINE 1 MG/3DAYS TD PT72
MEDICATED_PATCH | TRANSDERMAL | Status: AC
  Filled 2023-07-24: qty 2

## 2023-07-24 MED ORDER — DEXAMETHASONE SODIUM PHOSPHATE 10 MG/ML IJ SOLN
INTRAMUSCULAR | Status: AC
  Filled 2023-07-24: qty 1

## 2023-07-24 MED ORDER — LIDOCAINE 2% (20 MG/ML) 5 ML SYRINGE
INTRAMUSCULAR | Status: DC | PRN
  Administered 2023-07-24: 60 mg via INTRAVENOUS

## 2023-07-24 MED ORDER — PROPOFOL 10 MG/ML IV BOLUS
INTRAVENOUS | Status: AC
  Filled 2023-07-24: qty 20

## 2023-07-24 MED ORDER — LABETALOL HCL 5 MG/ML IV SOLN: 10.0000 mg | INTRAVENOUS | Status: DC | PRN

## 2023-07-24 MED ORDER — ACETAMINOPHEN 500 MG PO TABS
500.0000 mg | ORAL_TABLET | Freq: Four times a day (QID) | ORAL | Status: DC | PRN
  Filled 2023-07-24: qty 1

## 2023-07-24 MED ORDER — IBUPROFEN 200 MG PO TABS
400.0000 mg | ORAL_TABLET | Freq: Four times a day (QID) | ORAL | Status: DC | PRN
  Administered 2023-07-25 – 2023-07-26 (×4): 400 mg via ORAL
  Filled 2023-07-24 (×6): qty 2

## 2023-07-24 MED ORDER — AMISULPRIDE (ANTIEMETIC) 5 MG/2ML IV SOLN: 10.0000 mg | Freq: Once | INTRAVENOUS | Status: DC | PRN

## 2023-07-24 MED ORDER — HYDROMORPHONE HCL 1 MG/ML IJ SOLN
0.5000 mg | INTRAMUSCULAR | Status: DC | PRN
  Administered 2023-07-24 – 2023-07-26 (×6): 0.5 mg via INTRAVENOUS
  Filled 2023-07-24 (×7): qty 1

## 2023-07-24 MED ORDER — BUPIVACAINE HCL (PF) 0.25 % IJ SOLN
INTRAMUSCULAR | Status: AC
  Filled 2023-07-24: qty 30

## 2023-07-24 MED ORDER — FENTANYL CITRATE (PF) 250 MCG/5ML IJ SOLN
INTRAMUSCULAR | Status: AC
  Filled 2023-07-24: qty 5

## 2023-07-24 MED ORDER — FENTANYL CITRATE (PF) 100 MCG/2ML IJ SOLN
INTRAMUSCULAR | Status: AC
  Filled 2023-07-24: qty 2

## 2023-07-24 MED ORDER — CHLORHEXIDINE GLUCONATE CLOTH 2 % EX PADS
6.0000 | MEDICATED_PAD | Freq: Every day | CUTANEOUS | Status: DC
  Administered 2023-07-25 – 2023-07-26 (×2): 6 via TOPICAL

## 2023-07-24 MED ORDER — PROMETHAZINE HCL 25 MG/ML IJ SOLN: 6.2500 mg | INTRAMUSCULAR | Status: DC | PRN

## 2023-07-24 MED ORDER — DEXAMETHASONE SODIUM PHOSPHATE 10 MG/ML IJ SOLN
INTRAMUSCULAR | Status: DC | PRN
  Administered 2023-07-24: 10 mg via INTRAVENOUS

## 2023-07-24 MED ORDER — PANTOPRAZOLE SODIUM 40 MG IV SOLR
40.0000 mg | Freq: Every day | INTRAVENOUS | Status: DC
  Administered 2023-07-24 – 2023-07-25 (×2): 40 mg via INTRAVENOUS
  Filled 2023-07-24 (×2): qty 10

## 2023-07-24 MED ORDER — CHLORHEXIDINE GLUCONATE 0.12 % MT SOLN: 15.0000 mL | Freq: Once | OROMUCOSAL | Status: AC

## 2023-07-24 MED ORDER — THROMBIN (RECOMBINANT) 5000 UNITS EX SOLR: CUTANEOUS | Status: DC | PRN

## 2023-07-24 MED ORDER — ALBUTEROL SULFATE (2.5 MG/3ML) 0.083% IN NEBU: 3.0000 mL | INHALATION_SOLUTION | Freq: Four times a day (QID) | RESPIRATORY_TRACT | Status: DC | PRN

## 2023-07-24 MED ORDER — TOPIRAMATE 25 MG PO TABS
25.0000 mg | ORAL_TABLET | Freq: Two times a day (BID) | ORAL | Status: DC
  Administered 2023-07-25 – 2023-07-26 (×4): 25 mg via ORAL
  Filled 2023-07-24 (×5): qty 1

## 2023-07-24 MED ORDER — PROMETHAZINE HCL 25 MG PO TABS: 12.5000 mg | ORAL_TABLET | ORAL | Status: DC | PRN

## 2023-07-24 MED ORDER — ROCURONIUM BROMIDE 10 MG/ML (PF) SYRINGE
PREFILLED_SYRINGE | INTRAVENOUS | Status: DC | PRN
  Administered 2023-07-24: 50 mg via INTRAVENOUS
  Administered 2023-07-24: 20 mg via INTRAVENOUS

## 2023-07-24 MED ORDER — ACETAMINOPHEN 160 MG/5ML PO SOLN: 325.0000 mg | ORAL | Status: DC | PRN

## 2023-07-24 MED ORDER — LEVETIRACETAM IN NACL 500 MG/100ML IV SOLN
500.0000 mg | Freq: Two times a day (BID) | INTRAVENOUS | Status: DC
  Administered 2023-07-25 – 2023-07-26 (×4): 500 mg via INTRAVENOUS
  Filled 2023-07-24 (×4): qty 100

## 2023-07-24 MED ORDER — SUGAMMADEX SODIUM 200 MG/2ML IV SOLN
INTRAVENOUS | Status: DC | PRN
  Administered 2023-07-24: 300 mg via INTRAVENOUS

## 2023-07-24 MED ORDER — OXYCODONE HCL 5 MG PO TABS: 5.0000 mg | ORAL_TABLET | Freq: Once | ORAL | Status: DC | PRN

## 2023-07-24 MED ORDER — DEXMEDETOMIDINE HCL IN NACL 80 MCG/20ML IV SOLN
INTRAVENOUS | Status: DC | PRN
  Administered 2023-07-24: 12 ug via INTRAVENOUS

## 2023-07-24 MED ORDER — ROCURONIUM BROMIDE 10 MG/ML (PF) SYRINGE
PREFILLED_SYRINGE | INTRAVENOUS | Status: AC
  Filled 2023-07-24: qty 10

## 2023-07-24 MED ORDER — PROPOFOL 10 MG/ML IV BOLUS
INTRAVENOUS | Status: DC | PRN
Start: 2023-07-24 — End: 2023-07-24
  Administered 2023-07-24: 140 mg via INTRAVENOUS
  Administered 2023-07-24: 60 mg via INTRAVENOUS

## 2023-07-24 MED ORDER — DEXAMETHASONE SODIUM PHOSPHATE 4 MG/ML IJ SOLN
4.0000 mg | Freq: Four times a day (QID) | INTRAMUSCULAR | Status: DC
  Administered 2023-07-26 (×2): 4 mg via INTRAVENOUS
  Filled 2023-07-24 (×2): qty 1

## 2023-07-24 MED ORDER — ACETAMINOPHEN 325 MG PO TABS: 325.0000 mg | ORAL_TABLET | ORAL | Status: DC | PRN

## 2023-07-24 MED ORDER — SCOPOLAMINE 1 MG/3DAYS TD PT72
1.0000 | MEDICATED_PATCH | TRANSDERMAL | Status: DC
  Administered 2023-07-24: 1 via TRANSDERMAL

## 2023-07-24 MED ORDER — CHLORHEXIDINE GLUCONATE CLOTH 2 % EX PADS: 6.0000 | MEDICATED_PAD | Freq: Once | CUTANEOUS | Status: DC

## 2023-07-24 MED ORDER — CEFAZOLIN SODIUM-DEXTROSE 2-3 GM-%(50ML) IV SOLR
INTRAVENOUS | Status: DC | PRN
Start: 2023-07-24 — End: 2023-07-24
  Administered 2023-07-24: 2 g via INTRAVENOUS

## 2023-07-24 MED ORDER — PROPOFOL 500 MG/50ML IV EMUL
INTRAVENOUS | Status: DC | PRN
  Administered 2023-07-24: 20 ug/kg/min via INTRAVENOUS

## 2023-07-24 MED ORDER — LACTATED RINGERS IV SOLN: INTRAVENOUS | Status: DC | PRN

## 2023-07-24 MED ORDER — HYDROCODONE-ACETAMINOPHEN 5-325 MG PO TABS: 1.0000 | ORAL_TABLET | ORAL | Status: DC | PRN

## 2023-07-24 MED ORDER — CHLORHEXIDINE GLUCONATE CLOTH 2 % EX PADS
6.0000 | MEDICATED_PAD | Freq: Once | CUTANEOUS | Status: DC
  Administered 2023-07-24: 6 via TOPICAL

## 2023-07-24 MED ORDER — ONDANSETRON HCL 4 MG/2ML IJ SOLN
INTRAMUSCULAR | Status: AC
  Filled 2023-07-24: qty 2

## 2023-07-24 MED ORDER — VANCOMYCIN HCL IN DEXTROSE 1-5 GM/200ML-% IV SOLN: 1000.0000 mg | INTRAVENOUS | Status: DC

## 2023-07-24 MED ORDER — DEXAMETHASONE SODIUM PHOSPHATE 4 MG/ML IJ SOLN: 4.0000 mg | Freq: Three times a day (TID) | INTRAMUSCULAR | Status: DC

## 2023-07-24 MED ORDER — ONDANSETRON HCL 4 MG PO TABS: 4.0000 mg | ORAL_TABLET | ORAL | Status: DC | PRN

## 2023-07-24 MED ORDER — DOCUSATE SODIUM 100 MG PO CAPS
100.0000 mg | ORAL_CAPSULE | Freq: Two times a day (BID) | ORAL | Status: DC
  Administered 2023-07-25 – 2023-07-26 (×3): 100 mg via ORAL
  Filled 2023-07-24 (×4): qty 1

## 2023-07-24 MED ORDER — HYDROCODONE-ACETAMINOPHEN 5-325 MG PO TABS
1.0000 | ORAL_TABLET | ORAL | Status: DC | PRN
  Administered 2023-07-25 – 2023-07-26 (×4): 1 via ORAL
  Filled 2023-07-24 (×5): qty 1

## 2023-07-24 MED ORDER — ACETAMINOPHEN 10 MG/ML IV SOLN
INTRAVENOUS | Status: AC
  Filled 2023-07-24: qty 100

## 2023-07-24 MED ORDER — OXYCODONE HCL 5 MG/5ML PO SOLN: 5.0000 mg | Freq: Once | ORAL | Status: DC | PRN

## 2023-07-24 MED ORDER — VITAMIN B-2 100 MG PO TABS: 100.0000 mg | ORAL_TABLET | Freq: Every day | ORAL | Status: DC

## 2023-07-24 MED ORDER — ESMOLOL HCL 100 MG/10ML IV SOLN
INTRAVENOUS | Status: AC
  Filled 2023-07-24: qty 10

## 2023-07-24 MED ORDER — ACETAMINOPHEN 500 MG PO TABS: 1000.0000 mg | ORAL_TABLET | Freq: Once | ORAL | Status: DC

## 2023-07-24 MED ORDER — MIDAZOLAM HCL 2 MG/2ML IJ SOLN
INTRAMUSCULAR | Status: DC | PRN
  Administered 2023-07-24: 2 mg via INTRAVENOUS

## 2023-07-24 MED ORDER — CHLORHEXIDINE GLUCONATE 0.12 % MT SOLN
OROMUCOSAL | Status: AC
  Administered 2023-07-24: 15 mL via OROMUCOSAL
  Filled 2023-07-24: qty 15

## 2023-07-24 MED ORDER — METHOCARBAMOL 500 MG PO TABS
750.0000 mg | ORAL_TABLET | Freq: Three times a day (TID) | ORAL | Status: DC | PRN
  Administered 2023-07-25 – 2023-07-26 (×2): 750 mg via ORAL
  Filled 2023-07-24 (×3): qty 2

## 2023-07-24 MED ORDER — DIPHENHYDRAMINE HCL 50 MG/ML IJ SOLN
INTRAMUSCULAR | Status: DC | PRN
  Administered 2023-07-24: 12.5 mg via INTRAVENOUS

## 2023-07-24 MED ORDER — DEXAMETHASONE SODIUM PHOSPHATE 10 MG/ML IJ SOLN
6.0000 mg | Freq: Four times a day (QID) | INTRAMUSCULAR | Status: AC
  Administered 2023-07-24 – 2023-07-25 (×4): 6 mg via INTRAVENOUS
  Filled 2023-07-24 (×4): qty 1

## 2023-07-24 MED ORDER — HYDRALAZINE HCL 20 MG/ML IJ SOLN
INTRAMUSCULAR | Status: AC
  Filled 2023-07-24: qty 1

## 2023-07-24 SURGICAL SUPPLY — 62 items
ADH SKN CLS APL DERMABOND .7 (GAUZE/BANDAGES/DRESSINGS) ×1
APL SKNCLS STERI-STRIP NONHPOA (GAUZE/BANDAGES/DRESSINGS)
BAG COUNTER SPONGE SURGICOUNT (BAG) ×2 IMPLANT
BAG DRN CSF CATH SYS STRL MNTR (MISCELLANEOUS)
BAG SPNG CNTER NS LX DISP (BAG) ×1
BENZOIN TINCTURE PRP APPL 2/3 (GAUZE/BANDAGES/DRESSINGS) IMPLANT
BLADE CLIPPER SURG (BLADE) ×4 IMPLANT
BLADE SURG 11 STRL SS (BLADE) ×2 IMPLANT
BLADE ULTRA TIP 2M (BLADE) IMPLANT
BUR ACORN 9.0 PRECISION (BURR) ×2 IMPLANT
CANISTER SUCT 3000ML PPV (MISCELLANEOUS) ×2 IMPLANT
CLIP TI MEDIUM 6 (CLIP) IMPLANT
DERMABOND ADVANCED .7 DNX12 (GAUZE/BANDAGES/DRESSINGS) ×2 IMPLANT
DRAPE LAPAROTOMY 100X72 PEDS (DRAPES) ×2 IMPLANT
DRAPE MICROSCOPE SLANT 54X150 (MISCELLANEOUS) IMPLANT
DRAPE SURG 17X23 STRL (DRAPES) ×4 IMPLANT
DRAPE WARM FLUID 44X44 (DRAPES) ×2 IMPLANT
DRSG OPSITE POSTOP 4X8 (GAUZE/BANDAGES/DRESSINGS) IMPLANT
ELECT REM PT RETURN 9FT ADLT (ELECTROSURGICAL) ×1
ELECTRODE REM PT RTRN 9FT ADLT (ELECTROSURGICAL) ×2 IMPLANT
EVACUATOR 1/8 PVC DRAIN (DRAIN) IMPLANT
EVACUATOR SILICONE 100CC (DRAIN) IMPLANT
GAUZE 4X4 16PLY ~~LOC~~+RFID DBL (SPONGE) IMPLANT
GAUZE SPONGE 4X4 12PLY STRL (GAUZE/BANDAGES/DRESSINGS) IMPLANT
GLOVE BIO SURGEON STRL SZ7 (GLOVE) IMPLANT
GLOVE BIO SURGEON STRL SZ7.5 (GLOVE) IMPLANT
GLOVE BIO SURGEON STRL SZ8 (GLOVE) ×2 IMPLANT
GLOVE BIOGEL PI IND STRL 7.0 (GLOVE) IMPLANT
GLOVE EXAM NITRILE XL STR (GLOVE) IMPLANT
GLOVE INDICATOR 8.5 STRL (GLOVE) ×4 IMPLANT
GOWN STRL REUS W/ TWL LRG LVL3 (GOWN DISPOSABLE) ×2 IMPLANT
GOWN STRL REUS W/ TWL XL LVL3 (GOWN DISPOSABLE) ×2 IMPLANT
GOWN STRL REUS W/TWL 2XL LVL3 (GOWN DISPOSABLE) ×2 IMPLANT
GOWN STRL REUS W/TWL LRG LVL3 (GOWN DISPOSABLE) ×2
GOWN STRL REUS W/TWL XL LVL3 (GOWN DISPOSABLE) ×1
HEMOSTAT SURGICEL 2X14 (HEMOSTASIS) ×2 IMPLANT
KIT BASIN OR (CUSTOM PROCEDURE TRAY) ×2 IMPLANT
KIT TURNOVER KIT B (KITS) ×2 IMPLANT
NDL HYPO 22X1.5 SAFETY MO (MISCELLANEOUS) ×2 IMPLANT
NEEDLE HYPO 22X1.5 SAFETY MO (MISCELLANEOUS) ×1 IMPLANT
NS IRRIG 1000ML POUR BTL (IV SOLUTION) ×2 IMPLANT
PACK LAMINECTOMY NEURO (CUSTOM PROCEDURE TRAY) ×2 IMPLANT
PAD ARMBOARD 7.5X6 YLW CONV (MISCELLANEOUS) ×6 IMPLANT
PATTIES SURGICAL 1/4 X 3 (GAUZE/BANDAGES/DRESSINGS) ×2 IMPLANT
PIN MAYFIELD SKULL DISP (PIN) ×2 IMPLANT
SPONGE SURGIFOAM ABS GEL SZ50 (HEMOSTASIS) ×2 IMPLANT
SPONGE T-LAP 4X18 ~~LOC~~+RFID (SPONGE) IMPLANT
STAPLER SKIN PROX WIDE 3.9 (STAPLE) IMPLANT
SUT ETHILON 2 0 FS 18 (SUTURE) IMPLANT
SUT ETHILON 3 0 FSL (SUTURE) ×2 IMPLANT
SUT NURALON 4 0 TR CR/8 (SUTURE) ×4 IMPLANT
SUT PROLENE 6 0 BV (SUTURE) IMPLANT
SUT VIC AB 1 CT1 18XBRD ANBCTR (SUTURE) ×2 IMPLANT
SUT VIC AB 1 CT1 8-18 (SUTURE) ×2
SUT VIC AB 2-0 CT1 18 (SUTURE) ×2 IMPLANT
SUT VIC AB 3-0 SH 8-18 (SUTURE) IMPLANT
SYSTEM CSF EXTERNAL DRAINAGE (MISCELLANEOUS) IMPLANT
TOWEL GREEN STERILE (TOWEL DISPOSABLE) ×2 IMPLANT
TOWEL GREEN STERILE FF (TOWEL DISPOSABLE) IMPLANT
TRAY FOLEY MTR SLVR 16FR STAT (SET/KITS/TRAYS/PACK) IMPLANT
UNDERPAD 30X36 HEAVY ABSORB (UNDERPADS AND DIAPERS) IMPLANT
WATER STERILE IRR 1000ML POUR (IV SOLUTION) ×2 IMPLANT

## 2023-07-24 NOTE — Anesthesia Procedure Notes (Addendum)
Procedure Name: Intubation Date/Time: 07/24/2023 8:12 PM  Performed by: Deandre Stansel T, CRNAPre-anesthesia Checklist: Patient identified, Emergency Drugs available, Suction available and Patient being monitored Patient Re-evaluated:Patient Re-evaluated prior to induction Oxygen Delivery Method: Circle system utilized Preoxygenation: Pre-oxygenation with 100% oxygen Induction Type: IV induction Ventilation: Mask ventilation without difficulty Laryngoscope Size: Mac and 3 Grade View: Grade I Tube type: Oral Tube size: 7.0 mm Number of attempts: 1 Airway Equipment and Method: Stylet and Oral airway Placement Confirmation: ETT inserted through vocal cords under direct vision, positive ETCO2 and breath sounds checked- equal and bilateral Secured at: 21 cm Tube secured with: Tape Dental Injury: Teeth and Oropharynx as per pre-operative assessment

## 2023-07-24 NOTE — Progress Notes (Signed)
Pharmacy Antibiotic Note  Leah Stark is a 22 y.o. female admitted on 07/24/2023 with  s/p neurosurgical procedure  .  Pharmacy has been consulted for Vancomycin dosing surgical prophylaxis x 24 hours.   Plan: Vancomycin 1000 mg IV x 1 at 0800 7/30  Temp (24hrs), Avg:97.6 F (36.4 C), Min:97.4 F (36.3 C), Max:97.8 F (36.6 C)  Recent Labs  Lab 07/24/23 1601  WBC 8.3  CREATININE 0.88    CrCl cannot be calculated (Unknown ideal weight.).    Allergies  Allergen Reactions   Cephalexin Rash    Tolerated Ancef 03/20/2023    Abran Duke, PharmD, BCPS Clinical Pharmacist Phone: [phone]

## 2023-07-24 NOTE — Progress Notes (Signed)
eLink Physician-Brief Progress Note Patient Name: Leah Stark DOB: 14-Jan-2001 MRN: [id number]   Date of Service  07/24/2023  HPI/Events of Note  22 year old female 4 months out from suboccipital craniectomy with C1/C2 laminectomy for Chiari decompression who presents with late onset pseudomeningocele with CSF leak.  She is admitted to the ICU after reexploration of suboccipital and posterior cervical wound for repair of CSF leak and duraplasty.  Vital signs are normal.  Metabolic panel and CBC essentially within normal limits.  Mild nongap metabolic acidosis.  eICU Interventions  Dexamethasone per neurosurgery  Keppra and Topamax per neurosurgery.  Saline infusion with potassium.  Adequate as needed analgesics available.  DVT prophylaxis held, and GI prophylaxis with pantoprazole.  No immediate intervention indicated     Intervention Category Evaluation Type: New Patient Evaluation  Leah Stark 07/24/2023, 11:28 PM

## 2023-07-24 NOTE — Op Note (Signed)
Preoperative diagnosis: CSF leak pseudomeningocele postop suboccipital craniectomy for Chiari.  Postoperative diagnosis: Same.  Procedure: Reexploration of suboccipital and posterior cervical wound for repair of CSF leak following Chiari decompression and duraplasty with sewing fascial patch graft over dural defect.  Surgeon: Donalee Citrin.  Anesthesia: General  EBL: Minimal.  HPI: 22 year old female previous undergone suboccipital craniectomy C1-2 laminectomy with duraplasty for decompression of Chiari malformation.  Patient did very well for the first 2 and half months then noted a little increased and swelling around the back of her neck MRI scan showed a small pseudomeningocele under no pressure we observe this for a while however it got larger started causing pressure started spearing seeing recurrence of symptoms.  So I recommended reexploration of incision and decompression for identification of CSF leak and primary repair.  I extensively went over the risks and benefits of that operation with her as well as perioperative course expectations of outcome and alternatives to surgery and she understood and agreed to proceed forward.  Operative procedure: Patient was brought into the OR was induced under general anesthesia positioned prone in pins the backside of her head neck was shaved prepped and draped in routine sterile fashion.  Her old incision was then infiltrated with 10 cc lidocaine with epi and a midline incision was made immediately under pressure came clear CSF and the pseudomeningocele cavity was entered.  Then dissected down and immediately visualized the dural patch graft.  We performed Valsalva maneuvers and we did identify a single source of leak in the 3 o'clock position of the patch graft.  There was a nice film all around the entire perimeter and no additional spots were identified with multiple Valsalva maneuvers.  The dural patch was under mild amount of distention mild amount of  pressure.  So at this point I harvested a connective tissue fascial muscle patch of the right side of the posterior cervical incision and so this onto the area of the leak with multiple Nurolon's.  There was a small amount of leak after that coming out of one of the suture holes I sewed an additional piece of connective tissue over that.  Then perform multiple additional Valsalva so no other sources of leak utilizing the green glue fibrin glue product I then coated the suture line again especially over the repair I then scoured all the pseudomeningocele membrane to create some native inflammatory response to a seal this cavity.  I then sewed the fascia back tightly as well as subcutaneous tissue with Vicryl and a running nylon in the skin.  At the end the case all needle count sponge counts with correct the wound was dressed patient recovery in stable condition.

## 2023-07-24 NOTE — Anesthesia Preprocedure Evaluation (Signed)
Anesthesia Evaluation  Patient identified by MRN, date of birth, ID band Patient awake    Reviewed: Allergy & Precautions, NPO status , Patient's Chart, lab work & pertinent test results  History of Anesthesia Complications (+) PONV and history of anesthetic complications  Airway Mallampati: I  TM Distance: >3 FB Neck ROM: Full    Dental  (+) Teeth Intact, Dental Advisory Given   Pulmonary asthma    breath sounds clear to auscultation       Cardiovascular negative cardio ROS  Rhythm:Regular Rate:Normal     Neuro/Psych  Headaches  Anxiety        GI/Hepatic negative GI ROS, Neg liver ROS,,,  Endo/Other  negative endocrine ROS    Renal/GU negative Renal ROS     Musculoskeletal negative musculoskeletal ROS (+)    Abdominal   Peds  Hematology negative hematology ROS (+)   Anesthesia Other Findings   Reproductive/Obstetrics                             Anesthesia Physical Anesthesia Plan  ASA: 2  Anesthesia Plan: General   Post-op Pain Management: Tylenol PO (pre-op)*   Induction: Intravenous  PONV Risk Score and Plan: 4 or greater and Ondansetron, Dexamethasone, Midazolam and Scopolamine patch - Pre-op  Airway Management Planned: Oral ETT  Additional Equipment: None  Intra-op Plan:   Post-operative Plan: Extubation in OR  Informed Consent: I have reviewed the patients History and Physical, chart, labs and discussed the procedure including the risks, benefits and alternatives for the proposed anesthesia with the patient or authorized representative who has indicated his/her understanding and acceptance.     Dental advisory given  Plan Discussed with: CRNA  Anesthesia Plan Comments:        Anesthesia Quick Evaluation

## 2023-07-24 NOTE — H&P (Signed)
Leah Stark is an 22 y.o. female.   Chief Complaint: Pseudomeningocele neck swelling HPI: 22 year old female who is now over 4 months out from suboccipital craniectomy and C1-C2 laminectomy for Chiari decompression who presents with late onset pseudomeningocele and CSF leak.  Patient is been followed with serial MRIs have shown increased fluid and increased pressure patient has had a couple episodes of her preoperative symptoms of dizziness which had improved significantly postop.  No evidence of hydrocephalus on any of her imaging.  So we recommended reexploration for primary repair of CSF leak.  I extensively gone over the risks and benefits of the procedure with her and her family as well as perioperative course expectations of outcome and alternatives of surgery and she understands and agrees to proceed forward.  Past Medical History:  Diagnosis Date   Anxiety    Asthma    Headache    PONV (postoperative nausea and vomiting)     Past Surgical History:  Procedure Laterality Date   MULTIPLE TOOTH EXTRACTIONS     2 teeth   RADIOLOGY WITH ANESTHESIA N/A 02/07/2023   Procedure: MRI CERVICAL AND MRI THORASIC WITHOUT CONTRAST WITH ANESTHESIA;  Surgeon: Radiologist, Medication, MD;  Location: [facility] OR;  Service: Radiology;  Laterality: N/A;   SUBOCCIPITAL CRANIECTOMY CERVICAL LAMINECTOMY N/A 03/20/2023   Procedure: Suboccipital Craniectomy and Cervical One Laminectomy Cervical Two Laminectomy for Chiari Decompression;  Surgeon: Donalee Citrin, MD;  Location: [facility] OR;  Service: Neurosurgery;  Laterality: N/A;    Family History  Problem Relation Age of Onset   Heart attack Maternal Grandfather    Heart Problems Paternal Grandmother    Social History:  reports that she has never smoked. She has never been exposed to tobacco smoke. She has never used smokeless tobacco. She reports that she does not drink alcohol and does not use drugs.  Allergies:  Allergies  Allergen Reactions   Cephalexin Rash     Tolerated Ancef 03/20/2023    Medications Prior to Admission  Medication Sig Dispense Refill   acetaminophen (TYLENOL) 500 MG tablet Take 500 mg by mouth every 6 (six) hours as needed for mild pain, moderate pain, headache or fever.     FLUoxetine (PROZAC) 10 MG capsule Take 10 mg by mouth daily.     ibuprofen (ADVIL) 200 MG tablet Take 400 mg by mouth every 6 (six) hours as needed for moderate pain.     ibuprofen (ADVIL) 800 MG tablet Take 800 mg by mouth every 6 (six) hours as needed for moderate pain. for pain     Magnesium Oxide 500 MG TABS Take 500 mg by mouth at bedtime.     riboflavin (VITAMIN B-2) 100 MG TABS tablet Take 100 mg by mouth at bedtime.     topiramate (TOPAMAX) 25 MG tablet Take 1 tablet (25 mg total) by mouth 2 (two) times daily. 180 tablet 2   albuterol (VENTOLIN HFA) 108 (90 Base) MCG/ACT inhaler Inhale 2 puffs into the lungs every 6 (six) hours as needed for wheezing or shortness of breath.     HYDROcodone-acetaminophen (NORCO/VICODIN) 5-325 MG tablet Take 1 tablet by mouth every 4 (four) hours as needed for moderate pain. (Patient not taking: Reported on 05/30/2023) 30 tablet 0   methocarbamol (ROBAXIN) 500 MG tablet Take 1.5 tablets (750 mg total) by mouth every 8 (eight) hours as needed for muscle spasms. (Patient not taking: Reported on 05/30/2023) 30 tablet 1    Results for orders placed or performed during the hospital encounter of 07/24/23 (  from the past 48 hour(s))  Basic metabolic panel per protocol     Status: Abnormal   Collection Time: 07/24/23  4:01 PM  Result Value Ref Range   Sodium 135 135 - 145 mmol/L   Potassium 3.4 (L) 3.5 - 5.1 mmol/L   Chloride 108 98 - 111 mmol/L   CO2 17 (L) 22 - 32 mmol/L   Glucose, Bld 87 70 - 99 mg/dL    Comment: Glucose reference range applies only to samples taken after fasting for at least 8 hours.   BUN 8 6 - 20 mg/dL   Creatinine, Ser 6.21 0.44 - 1.00 mg/dL   Calcium 9.3 8.9 - 30.8 mg/dL   GFR, Estimated >65 >78 mL/min     Comment: (NOTE) Calculated using the CKD-EPI Creatinine Equation (2021)    Anion gap 10 5 - 15    Comment: Performed at [facility], [address], [place], [place] [address]  CBC per protocol     Status: Abnormal   Collection Time: 07/24/23  4:01 PM  Result Value Ref Range   WBC 8.3 4.0 - 10.5 K/uL   RBC 4.83 3.87 - 5.11 MIL/uL   Hemoglobin 14.1 12.0 - 15.0 g/dL   HCT 95.2 84.1 - 32.4 %   MCV 84.9 80.0 - 100.0 fL   MCH 29.2 26.0 - 34.0 pg   MCHC 34.4 30.0 - 36.0 g/dL   RDW 40.1 (L) 02.7 - 25.3 %   Platelets 237 150 - 400 K/uL   nRBC 0.0 0.0 - 0.2 %    Comment: Performed at [facility], [address] [place], [place] [address]  Pregnancy, urine POC     Status: None   Collection Time: 07/24/23  4:27 PM  Result Value Ref Range   Preg Test, Ur NEGATIVE NEGATIVE    Comment:        THE SENSITIVITY OF THIS METHODOLOGY IS >24 mIU/mL    No results found.  Review of Systems  Musculoskeletal:  Positive for neck pain.  Neurological:  Positive for light-headedness.    Blood pressure 113/71, pulse 70, temperature (!) 97.4 F (36.3 C), temperature source Oral, resp. rate 20, last menstrual period 07/02/2023, SpO2 97%. Physical Exam HENT:     Head: Normocephalic.     Right Ear: Tympanic membrane normal.     Nose: Nose normal.     Mouth/Throat:     Mouth: Mucous membranes are moist.  Eyes:     Pupils: Pupils are equal, round, and reactive to light.  Cardiovascular:     Rate and Rhythm: Normal rate.  Pulmonary:     Effort: Pulmonary effort is normal.  Abdominal:     General: Abdomen is flat.  Musculoskeletal:        General: Normal range of motion.     Cervical back: Normal range of motion.  Neurological:     Mental Status: She is alert.     Comments: Patient is awake and alert pupils are equal reactive cranial nerves are intact strength is 5 of 5 upper and lower extremities she has a small fluctuant fluid collection consistent with a pseudomeningocele on  her posterior cervical incision      Assessment/Plan 22 year old presents for reexploration of posterior cervical suboccipital incision for repair of CSF leak  Mariam Dollar, MD 07/24/2023, 5:12 PM

## 2023-07-24 NOTE — Transfer of Care (Signed)
Immediate Anesthesia Transfer of Care Note  Patient: Jasey Hadsall  Procedure(s) Performed: Re-exploration of Chiari decompression for repair of Cerebrospinal Fluid  leak (Head)  Patient Location: PACU  Anesthesia Type:General  Level of Consciousness: drowsy  Airway & Oxygen Therapy: Patient Spontanous Breathing and Patient connected to nasal cannula oxygen  Post-op Assessment: Report given to RN, Post -op Vital signs reviewed and stable, and Patient moving all extremities  Post vital signs: Reviewed and stable  Last Vitals:  Vitals Value Taken Time  BP 127/47 07/24/23 2200  Temp    Pulse 69 07/24/23 2203  Resp 15 07/24/23 2203  SpO2 100 % 07/24/23 2203  Vitals shown include unfiled device data.  Last Pain:  Vitals:   07/24/23 1610  TempSrc: Oral  PainSc: 1          Complications: No notable events documented.

## 2023-07-25 ENCOUNTER — Encounter (HOSPITAL_COMMUNITY): Payer: Self-pay | Admitting: Neurosurgery

## 2023-07-25 MED ORDER — VANCOMYCIN HCL IN DEXTROSE 1-5 GM/200ML-% IV SOLN
1000.0000 mg | Freq: Once | INTRAVENOUS | Status: AC
  Administered 2023-07-25: 1000 mg via INTRAVENOUS
  Filled 2023-07-25: qty 200

## 2023-07-25 MED ORDER — ORAL CARE MOUTH RINSE: 15.0000 mL | OROMUCOSAL | Status: DC | PRN

## 2023-07-25 MED FILL — Thrombin For Soln 5000 Unit: CUTANEOUS | Qty: 2 | Status: AC

## 2023-07-25 NOTE — Progress Notes (Signed)
Subjective: Patient reports doing well minimal headache  Objective: Vital signs in last 24 hours: Temp:  [97.4 F (36.3 C)-97.8 F (36.6 C)] 97.7 F (36.5 C) (07/30 0800) Pulse Rate:  [59-85] 59 (07/30 0700) Resp:  [9-20] 9 (07/30 0700) BP: (101-127)/(47-71) 101/52 (07/30 0700) SpO2:  [95 %-100 %] 95 % (07/30 0700)  Intake/Output from previous day: 07/29 0701 - 07/30 0700 In: 2722.7 [P.O.:350; I.V.:1922.7; IV Piggyback:450] Out: 650 [Urine:600; Blood:50] Intake/Output this shift: No intake/output data recorded.  Awake alert oriented neurologically intact  Lab Results: Recent Labs    07/24/23 1601 07/25/23 0002  WBC 8.3 8.3  HGB 14.1 13.8  HCT 41.0 39.5  PLT 237 220   BMET Recent Labs    07/25/23 0002 07/25/23 0428  NA 133* 135  K 3.4* 4.1  CL 106 109  CO2 17* 17*  GLUCOSE 118* 148*  BUN 8 8  CREATININE 0.86 0.93  CALCIUM 9.1 9.2    Studies/Results: No results found.  Assessment/Plan: Postop day 1 reexploration of suboccipital craniectomy for repair of CSF leak doing well the wound dry ambulate moved to the floor continue therapy.  LOS: 1 day     Mariam Dollar 07/25/2023, 8:31 AM

## 2023-07-25 NOTE — Anesthesia Postprocedure Evaluation (Signed)
Anesthesia Post Note  Patient: Leah Stark  Procedure(s) Performed: Re-exploration of Chiari decompression for repair of Cerebrospinal Fluid  leak (Head)     Patient location during evaluation: PACU Anesthesia Type: General Level of consciousness: awake and alert Pain management: pain level controlled Vital Signs Assessment: post-procedure vital signs reviewed and stable Respiratory status: spontaneous breathing, nonlabored ventilation, respiratory function stable and patient connected to nasal cannula oxygen Cardiovascular status: blood pressure returned to baseline and stable Postop Assessment: no apparent nausea or vomiting Anesthetic complications: no   No notable events documented.  Last Vitals:  Vitals:   07/25/23 0100 07/25/23 0115  BP: (!) 108/56 (!) 107/55  Pulse: 68 67  Resp: (!) 9 10  Temp:    SpO2: 96% 96%    Last Pain:  Vitals:   07/25/23 0055  TempSrc:   PainSc: Asleep                 Nelle Don Hayzel Ruberg

## 2023-07-25 NOTE — TOC CM/SW Note (Signed)
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 No TOC needs identified at this time but please place consult if needed.    Transition of Care Asessment: Insurance and Status: Insurance coverage has been reviewed Patient has primary care physician: Yes Home environment has been reviewed: From home Prior level of function:: Independent Prior/Current Home Services: No current home services Social Determinants of Health Reivew: SDOH reviewed no interventions necessary Readmission risk has been reviewed: Yes Transition of care needs: no transition of care needs at this time

## 2023-07-26 MED ORDER — HYDROCODONE-ACETAMINOPHEN 5-325 MG PO TABS: 1.0000 | ORAL_TABLET | ORAL | 0 refills | Status: AC | PRN

## 2023-07-26 MED ORDER — METHOCARBAMOL 500 MG PO TABS: 500.0000 mg | ORAL_TABLET | Freq: Four times a day (QID) | ORAL | 1 refills | Status: AC | PRN

## 2023-07-26 NOTE — Discharge Summary (Signed)
  Physician Discharge Summary  Patient ID: Leah Stark MRN: [id number] DOB/AGE: 15-Dec-2001 22 y.o. Estimated body mass index is 25.14 kg/m as calculated from the following:   Height as of 05/30/23: 5' 6.02" (1.677 m).   Weight as of 05/30/23: 70.7 kg.   Admit date: 07/24/2023 Discharge date: 07/26/2023  Admission Diagnoses: Pseudomeningocele CSF leak  Discharge Diagnoses: Same Principal Problem:   Pseudomeningocele of spinal cord Active Problems:   Postoperative CSF leak   Discharged Condition: good  Hospital Course: Patient admitted to hospital underwent reexploration of posterior cervical incision for repair of CSF leak.  Postop and patient went to the ICU did very well significant improvement in overall headaches.  By postop day 2 patient was stable for discharge home with scheduled follow-up in 1 to 2 weeks.  Patient be discharged on a steroid taper and pain medication.  Consults: Significant Diagnostic Studies: Treatments: Reexploration of suboccipital craniectomy and posterior cervical incision for repair of CSF leak Discharge Exam: Blood pressure (!) 100/58, pulse (!) 55, temperature 98.9 F (37.2 C), temperature source Oral, resp. rate 10, last menstrual period 07/02/2023, SpO2 96%. Awake alert neurologically intact  Disposition: Home   Allergies as of 07/26/2023       Reactions   Cephalexin Rash   Tolerated Ancef 03/20/2023        Medication List     TAKE these medications    albuterol 108 (90 Base) MCG/ACT inhaler Commonly known as: VENTOLIN HFA Inhale 2 puffs into the lungs every 6 (six) hours as needed for wheezing or shortness of breath.   FLUoxetine 10 MG capsule Commonly known as: PROZAC Take 10 mg by mouth daily.   HYDROcodone-acetaminophen 5-325 MG tablet Commonly known as: NORCO/VICODIN Take 1-2 tablets by mouth every 4 (four) hours as needed for moderate pain. What changed: how much to take   ibuprofen 200 MG tablet Commonly known as:  ADVIL Take 400 mg by mouth every 6 (six) hours as needed for moderate pain.   ibuprofen 800 MG tablet Commonly known as: ADVIL Take 800 mg by mouth every 6 (six) hours as needed for moderate pain. for pain   Magnesium Oxide -Mg Supplement 500 MG Tabs Take 500 mg by mouth at bedtime.   methocarbamol 500 MG tablet Commonly known as: ROBAXIN Take 1 tablet (500 mg total) by mouth every 6 (six) hours as needed for muscle spasms. What changed:  how much to take when to take this   riboflavin 100 MG Tabs tablet Commonly known as: VITAMIN B-2 Take 100 mg by mouth at bedtime.   topiramate 25 MG tablet Commonly known as: TOPAMAX Take 1 tablet (25 mg total) by mouth 2 (two) times daily.   TYLENOL 500 MG tablet Generic drug: acetaminophen Take 500 mg by mouth every 6 (six) hours as needed for mild pain, moderate pain, headache or fever.         Signed: Mariam Dollar 07/26/2023, 8:00 AM

## 2024-02-06 ENCOUNTER — Ambulatory Visit (INDEPENDENT_AMBULATORY_CARE_PROVIDER_SITE_OTHER): Payer: 59 | Admitting: Neurology
